# Patient Record
Sex: Female | Born: 1955 | Race: White | Hispanic: No | Marital: Married | Smoking: Former smoker
Health system: Southern US, Community
[De-identification: ages and names within clinical notes are randomized; demographics above are authoritative.]

## PROBLEM LIST (undated history)

## (undated) ENCOUNTER — Encounter: Attending: Gastroenterology | Primary: Gastroenterology

## (undated) ENCOUNTER — Encounter

## (undated) ENCOUNTER — Encounter: Attending: Chiropractor | Primary: Chiropractor

## (undated) ENCOUNTER — Ambulatory Visit: Attending: Pharmacist | Primary: Pharmacist

## (undated) ENCOUNTER — Ambulatory Visit

## (undated) ENCOUNTER — Telehealth

## (undated) ENCOUNTER — Ambulatory Visit: Payer: MEDICARE

## (undated) DIAGNOSIS — I700 Atherosclerosis of aorta: Secondary | ICD-10-CM

## (undated) DIAGNOSIS — L98499 Non-pressure chronic ulcer of skin of other sites with unspecified severity: Secondary | ICD-10-CM

## (undated) DIAGNOSIS — K219 Gastro-esophageal reflux disease without esophagitis: Secondary | ICD-10-CM

## (undated) DIAGNOSIS — N1330 Unspecified hydronephrosis: Secondary | ICD-10-CM

## (undated) DIAGNOSIS — R008 Other abnormalities of heart beat: Secondary | ICD-10-CM

## (undated) DIAGNOSIS — E7800 Pure hypercholesterolemia, unspecified: Secondary | ICD-10-CM

## (undated) DIAGNOSIS — F419 Anxiety disorder, unspecified: Secondary | ICD-10-CM

## (undated) DIAGNOSIS — I472 Ventricular tachycardia: Secondary | ICD-10-CM

## (undated) DIAGNOSIS — M1990 Unspecified osteoarthritis, unspecified site: Secondary | ICD-10-CM

## (undated) DIAGNOSIS — Z87442 Personal history of urinary calculi: Secondary | ICD-10-CM

## (undated) DIAGNOSIS — I809 Phlebitis and thrombophlebitis of unspecified site: Secondary | ICD-10-CM

## (undated) DIAGNOSIS — J302 Other seasonal allergic rhinitis: Secondary | ICD-10-CM

## (undated) DIAGNOSIS — D649 Anemia, unspecified: Secondary | ICD-10-CM

## (undated) DIAGNOSIS — H8109 Meniere's disease, unspecified ear: Secondary | ICD-10-CM

## (undated) DIAGNOSIS — K5090 Crohn's disease, unspecified, without complications: Secondary | ICD-10-CM

## (undated) HISTORY — PX: TONSILLECTOMY: SUR1361

## (undated) HISTORY — PX: FOOT SURGERY: SHX648

## (undated) HISTORY — PX: UPPER GI ENDOSCOPY: SHX6162

## (undated) HISTORY — PX: APPENDECTOMY: SHX54

## (undated) HISTORY — PX: OVARIAN CYST REMOVAL: SHX89

## (undated) HISTORY — PX: BOWEL RESECTION: SHX1257

---
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
## 1979-02-18 DIAGNOSIS — K5090 Crohn's disease, unspecified, without complications: Secondary | ICD-10-CM | POA: Insufficient documentation

## 1979-06-20 DIAGNOSIS — D649 Anemia, unspecified: Secondary | ICD-10-CM | POA: Insufficient documentation

## 2000-08-14 ENCOUNTER — Emergency Department (HOSPITAL_COMMUNITY): Admission: EM | Admit: 2000-08-14 | Discharge: 2000-08-14 | Payer: Self-pay | Admitting: Emergency Medicine

## 2000-08-14 ENCOUNTER — Encounter: Payer: Self-pay | Admitting: Emergency Medicine

## 2007-09-29 ENCOUNTER — Encounter: Admission: RE | Admit: 2007-09-29 | Discharge: 2007-09-29 | Payer: Self-pay | Admitting: Gastroenterology

## 2007-12-11 DIAGNOSIS — I472 Ventricular tachycardia, unspecified: Secondary | ICD-10-CM

## 2007-12-11 HISTORY — DX: Ventricular tachycardia: I47.2

## 2007-12-11 HISTORY — DX: Ventricular tachycardia, unspecified: I47.20

## 2008-06-19 DIAGNOSIS — R008 Other abnormalities of heart beat: Secondary | ICD-10-CM | POA: Insufficient documentation

## 2008-08-06 ENCOUNTER — Ambulatory Visit: Payer: Self-pay | Admitting: Internal Medicine

## 2008-08-12 ENCOUNTER — Inpatient Hospital Stay (HOSPITAL_COMMUNITY): Admission: EM | Admit: 2008-08-12 | Discharge: 2008-08-13 | Payer: Self-pay | Admitting: Emergency Medicine

## 2008-08-12 ENCOUNTER — Ambulatory Visit: Payer: Self-pay | Admitting: Cardiology

## 2008-08-19 ENCOUNTER — Ambulatory Visit: Payer: Self-pay

## 2008-10-28 ENCOUNTER — Ambulatory Visit: Payer: Self-pay | Admitting: Internal Medicine

## 2009-01-20 ENCOUNTER — Inpatient Hospital Stay (HOSPITAL_COMMUNITY): Admission: EM | Admit: 2009-01-20 | Discharge: 2009-01-22 | Payer: Self-pay | Admitting: Emergency Medicine

## 2009-01-30 ENCOUNTER — Emergency Department (HOSPITAL_COMMUNITY): Admission: EM | Admit: 2009-01-30 | Discharge: 2009-01-31 | Payer: Self-pay | Admitting: Emergency Medicine

## 2009-06-19 DIAGNOSIS — K219 Gastro-esophageal reflux disease without esophagitis: Secondary | ICD-10-CM | POA: Insufficient documentation

## 2009-07-05 ENCOUNTER — Encounter: Admission: RE | Admit: 2009-07-05 | Discharge: 2009-07-05 | Payer: Self-pay | Admitting: Gastroenterology

## 2009-08-29 ENCOUNTER — Encounter (HOSPITAL_COMMUNITY): Admission: RE | Admit: 2009-08-29 | Discharge: 2009-11-27 | Payer: Self-pay | Admitting: Gastroenterology

## 2009-12-05 ENCOUNTER — Encounter (HOSPITAL_COMMUNITY): Admission: RE | Admit: 2009-12-05 | Discharge: 2010-03-05 | Payer: Self-pay | Admitting: Gastroenterology

## 2010-03-13 ENCOUNTER — Encounter (HOSPITAL_COMMUNITY): Admission: RE | Admit: 2010-03-13 | Discharge: 2010-06-08 | Payer: Self-pay | Admitting: Gastroenterology

## 2010-07-17 ENCOUNTER — Encounter (HOSPITAL_COMMUNITY): Admission: RE | Admit: 2010-07-17 | Discharge: 2010-07-17 | Payer: Self-pay | Admitting: Gastroenterology

## 2010-09-15 ENCOUNTER — Encounter (HOSPITAL_COMMUNITY)
Admission: RE | Admit: 2010-09-15 | Discharge: 2010-11-09 | Payer: Self-pay | Source: Home / Self Care | Admitting: Gastroenterology

## 2011-01-04 ENCOUNTER — Encounter (HOSPITAL_COMMUNITY)
Admission: RE | Admit: 2011-01-04 | Discharge: 2011-01-09 | Payer: Self-pay | Source: Home / Self Care | Attending: Gastroenterology | Admitting: Gastroenterology

## 2011-03-01 ENCOUNTER — Encounter (HOSPITAL_COMMUNITY): Payer: BC Managed Care – PPO | Attending: Gastroenterology

## 2011-03-01 DIAGNOSIS — K5090 Crohn's disease, unspecified, without complications: Secondary | ICD-10-CM | POA: Insufficient documentation

## 2011-03-27 LAB — URINALYSIS, ROUTINE W REFLEX MICROSCOPIC
Bilirubin Urine: NEGATIVE
Bilirubin Urine: NEGATIVE
Glucose, UA: NEGATIVE mg/dL
Ketones, ur: NEGATIVE mg/dL
Ketones, ur: NEGATIVE mg/dL
Leukocytes, UA: NEGATIVE
Nitrite: NEGATIVE
Protein, ur: NEGATIVE mg/dL
Protein, ur: NEGATIVE mg/dL

## 2011-03-27 LAB — CBC
HCT: 32.1 % — ABNORMAL LOW (ref 36.0–46.0)
HCT: 37.3 % (ref 36.0–46.0)
MCV: 93.3 fL (ref 78.0–100.0)
MCV: 93.7 fL (ref 78.0–100.0)
MCV: 91.9 fL (ref 78.0–100.0)
Platelets: 227 10*3/uL (ref 150–400)
Platelets: 256 10*3/uL (ref 150–400)
RBC: 4.05 MIL/uL (ref 3.87–5.11)
RDW: 15.9 % — ABNORMAL HIGH (ref 11.5–15.5)
WBC: 6.3 10*3/uL (ref 4.0–10.5)
WBC: 6.8 10*3/uL (ref 4.0–10.5)

## 2011-03-27 LAB — LIPID PANEL
Total CHOL/HDL Ratio: 2.4 RATIO
Triglycerides: 92 mg/dL (ref <150)
VLDL: 18 mg/dL (ref 0–40)

## 2011-03-27 LAB — PHOSPHORUS: Phosphorus: 3.1 mg/dL (ref 2.3–4.6)

## 2011-03-27 LAB — MAGNESIUM
Magnesium: 0.7 mg/dL — CL (ref 1.5–2.5)
Magnesium: 1.6 mg/dL (ref 1.5–2.5)

## 2011-03-27 LAB — BASIC METABOLIC PANEL
BUN: 9 mg/dL (ref 6–23)
CO2: 27 mEq/L (ref 19–32)
Calcium: 7.3 mg/dL — ABNORMAL LOW (ref 8.4–10.5)
Chloride: 105 mEq/L (ref 96–112)
Creatinine, Ser: 0.86 mg/dL (ref 0.4–1.2)
GFR calc Af Amer: >60 mL/min (ref >60)
GFR calc non Af Amer: >60 mL/min (ref >60)
Glucose, Bld: 96 mg/dL (ref 70–99)
Potassium: 3 mEq/L — ABNORMAL LOW (ref 3.5–5.1)
Sodium: 143 mEq/L (ref 135–145)

## 2011-03-27 LAB — DIFFERENTIAL
Basophils Relative: 0 % (ref 0–1)
Eosinophils Absolute: 0.1 10*3/uL (ref 0.0–0.7)
Eosinophils Absolute: 0 10*3/uL (ref 0.0–0.7)
Eosinophils Relative: 1 % (ref 0–5)
Lymphs Abs: 1.6 10*3/uL (ref 0.7–4.0)
Monocytes Absolute: 0.3 10*3/uL (ref 0.1–1.0)
Monocytes Relative: 7 % (ref 3–12)
Monocytes Relative: 5 % (ref 3–12)
Neutrophils Relative %: 66 % (ref 43–77)

## 2011-03-27 LAB — CARDIAC PANEL(CRET KIN+CKTOT+MB+TROPI)
CK, MB: 1.3 ng/mL (ref 0.3–4.0)
Relative Index: INVALID (ref 0.0–2.5)
Troponin I: <0.01 ng/mL (ref 0.00–0.06)

## 2011-03-27 LAB — COMPREHENSIVE METABOLIC PANEL
ALT: 15 U/L (ref 0–35)
AST: 25 U/L (ref 0–37)
Albumin: 3.1 g/dL — ABNORMAL LOW (ref 3.5–5.2)
BUN: 6 mg/dL (ref 6–23)
CO2: 25 mEq/L (ref 19–32)
Calcium: 8 mg/dL — ABNORMAL LOW (ref 8.4–10.5)
Calcium: 9.2 mg/dL (ref 8.4–10.5)
Chloride: 106 mEq/L (ref 96–112)
Creatinine, Ser: 0.92 mg/dL (ref 0.4–1.2)
GFR calc Af Amer: 58 mL/min — ABNORMAL LOW (ref >60)
GFR calc non Af Amer: 48 mL/min — ABNORMAL LOW (ref >60)
Potassium: 3.3 mEq/L — ABNORMAL LOW (ref 3.5–5.1)
Sodium: 137 mEq/L (ref 135–145)
Total Protein: 5.4 g/dL — ABNORMAL LOW (ref 6.0–8.3)

## 2011-03-27 LAB — CALCIUM: Calcium: 7.4 mg/dL — ABNORMAL LOW (ref 8.4–10.5)

## 2011-03-27 LAB — POTASSIUM: Potassium: 3.6 mEq/L (ref 3.5–5.1)

## 2011-03-27 LAB — CK TOTAL AND CKMB (NOT AT ARMC): Relative Index: INVALID (ref 0.0–2.5)

## 2011-03-27 LAB — POCT I-STAT, CHEM 8
BUN: 15 mg/dL (ref 6–23)
Calcium, Ion: 1.29 mmol/L (ref 1.12–1.32)
Creatinine, Ser: 1.2 mg/dL (ref 0.4–1.2)
TCO2: 25 mmol/L (ref 0–100)

## 2011-03-27 LAB — VITAMIN B12: Vitamin B-12: 319 pg/mL (ref 211–911)

## 2011-03-27 LAB — PROTIME-INR
INR: 1 (ref 0.00–1.49)
Prothrombin Time: 13.4 seconds (ref 11.6–15.2)

## 2011-03-27 LAB — TROPONIN I: Troponin I: <0.01 ng/mL (ref 0.00–0.06)

## 2011-03-27 LAB — POCT CARDIAC MARKERS: Myoglobin, poc: 63.2 ng/mL (ref 12–200)

## 2011-03-27 LAB — HEMOGLOBIN A1C: Mean Plasma Glucose: 120 mg/dL

## 2011-04-24 NOTE — H&P (Signed)
Cheryl [facility], Cheryl Ferguson                ACCOUNT NO.:  [id number]   MEDICAL RECORD NO.:  [id number]          PATIENT TYPE:  INP   LOCATION:  2926                         FACILITY:  [facility]   PHYSICIAN:  Minus Breeding, MD, FACCDATE OF BIRTH:  [birth year]   DATE OF ADMISSION:  08/12/2008  DATE OF DISCHARGE:                              HISTORY & PHYSICAL   CHIEF COMPLAINT:  Chest pain.   HISTORY OF PRESENT ILLNESS:  Ms. Cheryl [facility] is a 55 year old female with a  history of brief runs of nonsustained VT.  She saw Dr. Lovena Le on August 06, 2008, because her palpitations had been increasing and received  prescriptions for verapamil.  She had palpitations yesterday and took  one verapamil.  Her symptoms improved, and no further action was taken.  Today at approximately 3 p.m., she had onset of frequent palpitations.  She describes them as a fluttering feeling in her chest.  She then  developed an aching and cramping feeling in her chest as well as chest  tightness.  Her symptoms did not resolve, so she went to [facility].  There, she was evaluated and referred to [facility].  Currently, she is not having palpitations, but she does  still complain of chest tightness at a 2/10.  The palpitations and chest  tightness are associated with some slight presyncope.  She also has a  little dyspnea, but denies nausea, vomiting, or diaphoresis.   PAST MEDICAL HISTORY:  1. She has no history of diabetes, hypertension, hyperlipidemia, or      family history of premature coronary artery disease.  2. She has a history of tobacco use.  3. She has a history of PVCs and has had brief episodes of      nonsustained VT per Dr. Tanna Furry note from 2004.  4. History of Crohn disease.   PAST SURGICAL HISTORY:  She is status post C-section, bowel resection,  tonsillectomy, and ovarian cyst removal.   ALLERGIES:  She is allergic or intolerant to [place].   CURRENT MEDICATIONS:  1.  Entocort EC 3 mg 2 tablets daily.  2. Lunesta 2 mg nightly.  3. Verapamil 40 mg p.o. q.6 h. p.r.n.  4. Verapamil ER 240 mg daily (has not started yet per instructions).  5. Maxzide one tablet daily.  6. Nexium 40 mg daily.  7. Pentasa 500 mg 2 tablets q.i.d.  8. Lisinopril 20 mg daily.   SOCIAL HISTORY:  She lives in [place] with her husband and works  for [employer].  She has an approximately 35-pack-year  history of tobacco use, but quit in the last few months.  She denies  alcohol or drug abuse.  She feels that her diet is reasonable and does  not exercise.   FAMILY HISTORY:  Her mother died at age 37 of cancer, and her father  died at age 38 with the CVA, but neither parents nor siblings have heart  disease.   REVIEW OF SYSTEMS:  She has had no fevers, chills, or recent illnesses.  Her Crohn disease has been moderate per  the patient.  Has chest pain as  described above.  She has occasional diarrhea and abdominal pain.  Full  14-point of review of systems is otherwise negative.   PHYSICAL EXAMINATION:  VITAL SIGNS:  Temperature is 97.7, blood pressure  173/86, pulse 92, respiratory rate 22, and O2 saturation 98% on room  air.  GENERAL:  She is a well-developed and anxious white female.  HEENT:  Normal.  NECK:  There is no lymphadenopathy, thyromegaly, bruit, or JVD noted.  CV:  Her heart is regular in rate and rhythm with an S1 and S2.  No  significant murmur, rub, or gallop is noted.  Distal pulses are intact  in all 4 extremities.  LUNGS:  Clear to auscultation bilaterally.  SKIN:  No rashes or lesions are noted.  ABDOMEN:  Soft and nontender with active bowel sounds.  There is no  rebound and no guarding noted.  EXTREMITIES:  There is no cyanosis, clubbing, or edema noted.  MUSCULOSKELETAL:  There is no joint deformity or effusion, and no spine  or CVA tenderness.  NEURO:  She is alert and oriented.  Cranial nerves II through XII  grossly intact.    Chest x-ray is normal.   EKG with sinus rhythm, rate 72, with a pair of PVCs.  There are no acute  ischemic changes.  There are minimal lateral T-wave changes, the  significance of which is not clear.   LABORATORY DATA:  Hemoglobin 10.8, hematocrit 33.4, WBC 6.7, and  platelets 277.  Sodium 135, potassium 3.5, chloride 108, CO2 of 30.  BUN  10, creatinine 1.07, and glucose 103.  GFR 54.  Albumin 3.4, calcium  8.3, magnesium 1.04, and point-of-care markers negative x1.   IMPRESSION:  Ms. [facility] was seen today by Dr. Percival Spanish.  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  She has had a  cardiac MRI and single-averaged electrocardiogram, both of which were  negative for arrhythmogenic right ventricular dysplasia.  Today, she had  tachy palpations and took instant release verapamil.  She developed  chest pain described as a tightness.  She was treated at urgent care  with nitroglycerin and her symptoms improved.  She has no acute EKG  changes, but ventricular couplets were seen, which were treated with IV  lidocaine and resolved.  The pain went away initially, but is now  recurred.   PLAN:  1. Ventricular ectopy and nonsustained VT:  We will continue the      lidocaine if she is symptomatic with the PVCs.  We will start      verapamil short-acting scheduled q.6 h. and transition to sustained      release per Dr. Lovena Le.  He can decide on continuing this      medication versus other antiarrhythmics or ablation.  2. Chest pain:  We will rule out MI.  She will be started on IV      nitroglycerin.  An outpatient Myoview is indicated if her enzymes      are negative.  3. Hypertension:  Adding verapamil should improve this.  4. Crohn disease:  She will be continued on her home medications.  5. Hypomagnesemia.  We will add Mag-Ox b.i.d.      Cheryl Ferries, PA-C      Minus Breeding, MD,  [facility]  Electronically Signed    RB/MEDQ  D:  08/12/2008  T:  08/13/2008  Job:  [id number] 

## 2011-04-24 NOTE — Assessment & Plan Note (Signed)
[facility] OFFICE NOTE   LASEAN, RAHMING                         MRN:          [id number]  DATE:10/28/2008                            DOB:          06-04-1956    Ms. [facility] returns today for followup.  She is a very pleasant young  woman with a history of tachypalpitations and RV outflow tract VT.  I  saw her back in August, and her palpitations had increased and we  decided to try her initially on short-acting verapamil to take on a  p.r.n. basis, but apparently she had trouble with this and had her heart  race and was actually hospitalized for a day.  She underwent stress  testing as she had some chest pain with her racing, all of this came  negative.  She was placed on long-acting verapamil and she has been  stable since.  She does have rare days where she has palpitations, but  no severe episodes.   PHYSICAL EXAMINATION:  GENERAL:  She is a pleasant, well-appearing young  woman in no distress.  VITAL SIGNS:  Blood pressure was 110/72, the pulse 76 and regular,  respirations were 18, and the weight was 144 pounds.  NECK:  No jugular venous distention.  LUNGS:  Clear bilaterally to auscultation.  No wheezes, rales, or  rhonchi are present.  There is no increased work of breathing.  CARDIOVASCULAR:  Regular rate and rhythm.  Normal S1 and S2.  ABDOMEN:  Soft, nontender.  There is no organomegaly.  EXTREMITIES:  No edema.   IMPRESSION:  1. Right ventricular outflow tract ventricular tachycardia, now on      long-acting daily verapamil with good control.  2. Palpitations secondary to right ventricular outflow tract      ventricular tachycardia.  3. Crohn disease, probably improved with regard to diarrhea from      verapamil treatment.   DISCUSSION:  Overall, Ms. [facility] is stable.  I will see her back in a  year.     Champ Mungo. Lovena Le, MD  Electronically Signed    GWT/MedQ  DD: 10/28/2008  DT:  10/28/2008  Job #: [id number]

## 2011-04-24 NOTE — Discharge Summary (Signed)
[facility], [facility]                ACCOUNT NO.:  [id number]   MEDICAL RECORD NO.:  [id number]          PATIENT TYPE:  INP   LOCATION:  2926                         FACILITY:  [facility]   PHYSICIAN:  Thompson Grayer, MD       DATE OF BIRTH:  1956-04-26   DATE OF ADMISSION:  08/12/2008  DATE OF DISCHARGE:  08/13/2008                               DISCHARGE SUMMARY   PROCEDURES:  None.   PRIMARY/FINAL DISCHARGE DIAGNOSIS:  Chest pain, cardiac enzymes negative  for myocardial infarction.  An outpatient Myoview planned.   SECONDARY DIAGNOSES:  1. Palpitations, premature ventricular contractions in pairs seen on      telemetry.  2. History of short runs of nonsustained ventricular tachycardia.  3. Crohn disease.  4. History of tobacco use, quit recently.  5. Allergy or intolerance to CODEINE and KEFLEX.  6. Status post cesarean section, bowel resection, tonsillectomy, and      ovarian cyst removal.  7. Family history of cerebrovascular accident in her father.   TIME AT DISCHARGE:  34 minutes.   HOSPITAL COURSE:  Ms. [facility] is a 55 year old female with no previous  history of coronary artery disease.  She had seen Dr. Lovena Le on August 06, 2008, then was having palpitations so received a prescription for  verapamil p.r.n. and long acting.  She had repeat palpitations and took  the p.r.n. medications and they resolved.  On the day of admission, she  had palpitations associated with chest pain.  She had not yet started  the long-acting verapamil and the short-acting verapamil did not relieve  her symptoms.  She came to the hospital where she was admitted for  further evaluation and treatment.  Her cardiac enzymes were negative for  MI.  Other labs were within normal limits except for a hemoglobin of  10.8 and hematocrit of 33.4.  MCV was within normal limits at 96.2.  Her  nonfasting blood sugar was 103.  Her BUN was 10 and creatinine of 1.07  with a GFR of 54.  Albumin was minimally low at  3.4.  Calcium was  slightly low at 8.3 and magnesium was low at 1.0.  Supplementation was  started.   On August 13, 2008, she was seen by Dr. Rayann Heman.  She had been started  on lidocaine drip and had no further palpitations on this with no PVCs  seen on telemetry after it was started.  Her chest pain had resolved.  She was encouraged to start the verapamil long acting that Dr. Lovena Le  had prescribed.  Since her chest pain had resolved and her cardiac  enzymes were negative.  He felt that she was stable for discharge and  outpatient followup.   DISCHARGE INSTRUCTIONS:  1. Her activity level is to be increased gradually.  She is to stick      to a low-fat and salt diet.  2. She is to follow up with Dr. Lovena Le on October 18, 2008, at 4:30.  3. She is to have a stress test on August 19, 2008, at noon.  4. She is to follow  up with Dr. Maceo Pro as needed.  5. Return for worsening chest pain, palpitaitons, shortness of breath,      syncope, or other concerns   DISCHARGE MEDICATIONS:  1. Verapamil 240 mg daily.  2. Maxzide 37.5/25 daily.  3. Mesalamine 500 mg 2 tablets q.i.d.  4. Entocort 3 mg 2 tablets daily.  5. Lisinopril 20 mg a day.  6. Mag-Ox 400 mg b.i.d.  7. Nexium 40 mg a day.  8. Lunesta 2 mg nightly.      Rosaria Ferries, PA-C      Thompson Grayer, MD  Electronically Signed    RB/MEDQ  D:  08/13/2008  T:  08/14/2008  Job:  [id number]   cc:   [facility] L. Maceo Pro, M.D.

## 2011-04-24 NOTE — Assessment & Plan Note (Signed)
[facility] OFFICE NOTE   MALASIA, TORAIN                         MRN:          [id number]  DATE:08/06/2008                            DOB:          1956-02-26    [facility] returns today for followup.  I have not seen her for almost 5  years in our EP clinic.  She is a very pleasant middle-aged woman with a  history of Crohn disease and palpitations documented to be due  nonsustained VT and frequent PVCs.  These appear to be coming from the  RV outflow tract.  She had a cardiac MI that demonstrated no evidence of  ARVD.  I initially had her on verapamil which she took for several  years, though eventually her symptoms improve such that she was able to  stop her verapamil completely.  The patient has been stable, but has  recently had some flaring of her Crohn disease and notes that she has  had increasingly frequent episodes of palpitations.  She returns today  for additional evaluation.  She has had no chest pain.  She denies  worsening heart failure.  She denies peripheral edema.   CURRENT MEDICATIONS:  1. Pentasa 2 tablets 4 times daily.  2. Nexium 40 a day.  3. Lisinopril 20 a day.  4. Magnesium oxide 400 mg twice daily.  5. Imuran daily.  6. __________.   PHYSICAL EXAMINATION:  GENERAL:  She is a pleasant well-appearing middle-  aged woman in no distress.  VITAL SIGNS:  Blood pressure was 104/60, the pulse was 60 and regular,  respirations were 18, and the weight was 142 pounds.  NECK:  No jugular venous distention.  LUNGS:  Clear bilaterally to auscultation.  No wheezes, rales, or  rhonchi are present.  CARDIOVASCULAR:  Regular rate and rhythm with normal S1 and S2.  There  are no murmurs, rubs, or gallops present.  ABDOMEN:  Soft and nontender.  There is no organomegaly.  EXTREMITIES:  Demonstrate no cyanosis, clubbing, or edema.  The pulses  were 2+ and symmetric.   EKG demonstrates sinus  rhythm with nonspecific T-wave abnormality.   IMPRESSION:  1. Worsening palpitations, previously controlled with verapamil.      Secondary to premature ventricular contractions coming from the      right ventricular outflow tract region.  2. Crohn disease.   DISCUSSION:  I have recommend that [facility] start back on verapamil.  The real question is whether she needs to take it on a p.r.n. basis with  a short-acting dose or whether she will do better with a long-term  verapamil.  I have given her 2 prescriptions today, one for verapamil  short-acting 90 mg daily that she can take if she has symptoms which  typically occur in early afternoon and the second is to go on a long-  acting verapamil with a dose of 240 mg a day taken once daily.  If she  has frequent episodes of PVCs and palpitations, then I have to ask her  to go on the long-acting version of verapamil.  I will plan to see the  patient back in several months.  She is instructed to call us if her  verapamil does not work for her.      Champ Mungo. Lovena Le, MD  Electronically Signed    GWT/MedQ  DD: 08/06/2008  DT: 08/07/2008  Job #: [id number]   cc:   [facility] L. Maceo Pro, M.D.

## 2011-04-24 NOTE — H&P (Signed)
[facility], [facility]                ACCOUNT NO.:  [id number]   MEDICAL RECORD NO.:  [id number]          PATIENT TYPE:  INP   LOCATION:  1308                         FACILITY:  [facility]   PHYSICIAN:  Farris Has, MDDATE OF BIRTH:  1956-08-03   DATE OF ADMISSION:  01/20/2009  DATE OF DISCHARGE:                              HISTORY & PHYSICAL   PRIMARY CARE Meylin Stenzel:  Audree Camel. Alyson Ingles, M.D.   GI SPECIALIST:  Joyice Faster. Oletta Lamas, M.D.   CARDIOLOGIST:  Jettie Booze, M.D.   The patient is a 55 year old female with history of Crohn disease and  chronic magnesium deficiency.  The patient was at her baseline of health  until today when she developed some palpitations, no chest pain, no  shortness of breath.  She also developed some sensations of tingling  around her face and spasms in the muscles of the face, hands and neck  which scared her, and she came into the emergency department.   She has been wearing a heart monitor for about 2 weeks per Dr.  Hassell Done orders, given history of ventricular tachycardia.  She  supposedly had undergone an echogram just a few weeks ago at his office,  results of which we do not have.   Otherwise review of systems unremarkable.  No fevers and no chills.  No  nausea, no vomiting, no constipation or chest pain.  The patient does  have occasional diarrhea once a day or so.   PAST MEDICAL HISTORY:  1. Significant for Crohn disease.  2. GERD.  3. Hypokalemia.  4. Hypomagnesemia.  5. Paroxysmal ventricular tachycardia.   SOCIAL HISTORY:  The patient never smoked or drank, does not abuse  drugs.  Lives at home.   FAMILY HISTORY:  Noncontributory.   ALLERGIES:  CODEINE and KEFLEX.   MEDICATIONS:  1. Entocort 3 mg daily.  2. Imuran 50 mg daily.  3. Magnesium 400 mg daily.  4. Nexium 40 mg p.o. daily.  5. Verapamil 240 mg daily.  6. Lisinopril 20 mg daily.  7. Pentasa 500 mg p.o. 2 tablets q.i.d.  8. Xanax 0.5 mg p.o. q.h.s. and 0.25 mg  as needed every 6-8 hours      throughout the day.   VITALS:  Temperature 97.5, blood pressure 161/83, now down to 113/80,  pulse 86, respirations 16, saturation 96% on room air .  The patient  appears to be currently in no acute distress.  HEAD:  Nontraumatic.  Moist mucous membranes.  LUNGS:  Clear to auscultation bilaterally.  HEART:  Regular rate and rhythm.  No murmurs, rubs or gallops.  LUNGS:  Clear to auscultation bilaterally.  ABDOMEN:  Soft, nontender, nondistended.  LOWER EXTREMITIES:  No clubbing, cyanosis or edema.  NEUROLOGICALLY: Intact.  Her spasms currently resolved.   LABS:  The patient had no CBC.  Chemistry significant for sodium 143,  potassium 3.0, creatinine 0.86, calcium 7.3, magnesium level is 0.7.  EKG obtained and showing normal sinus rhythm, heart rate of 79, no  ischemic changes noted, no U waves noted.   ASSESSMENT AND PLAN:  This is a 55 year old  female with a history of  hypomagnesemia and hypokalemia of unclear etiology and Crohn disease  presents with low potassium and low magnesium and calcium.   1. Low magnesium;  We will replace.  I wonder if patient will need      nephrology workup to try to help [place] figure out the cause of her      constant magnesium loss.  We will try to obtain urine magnesium      level, but that may be not as helpful since the patient already      received magnesium.  2. Hypokalemia:  Will replace.  3. Low calcium:  Will replace.  4. Palpitations:  The patient does have history of V-tach, we will      place on telemetry.  The patient already had a recent      echocardiogram.  Will need to contact Dr. Irish Lack to see what the      results of that are.  Will cycle cardiac enzymes, wonder if      electrolyte imbalances may contribute to palpitations.  5. Prophylaxis:  Protonix plus Lovenox.  6. History of Crohn's:  Will continue home meds, currently stable.      Farris Has, MD  Electronically Signed      AVD/MEDQ  D:  01/21/2009  T:  01/21/2009  Job:  [id number]   cc:   [facility] A. Alyson Ingles, M.D.  Fax: [phone]

## 2011-04-24 NOTE — Discharge Summary (Signed)
[facility]yl [facility], [facility]                ACCOUNT NO.:  [id number]   MEDICAL RECORD NO.:  [id number]          PATIENT TYPE:  INP   LOCATION:  [place]                         FACILITY:  [facility]   PHYSICIAN:  Corinna L. Conley Canal, MDDATE OF BIRTH:  08/07/1956   DATE OF ADMISSION:  01/20/2009  DATE OF DISCHARGE:  01/22/2009                               DISCHARGE SUMMARY   DISCHARGE DIAGNOSES:  1. Hypomagnesemia.  2. Hyperkalemia.  3. History of Crohn's, currently quiescent.  4. Hypertension.  5. History of ventricular arrhythmias, none during this      hospitalization.  6. Gastroesophageal reflux disease.  7. Anxiety.  8. History of several bowel resections.   DISCHARGE MEDICATIONS:  Stop Entocort EC. Hold lisinopril p.r.n.  systolic blood pressure below 100. If systolic blood pressure remains  below 100, hold for verapamil as well and call Dr. Irish Lack for  instructions. Potassium 20 mEq twice a day until gone.  Continue  magnesium oxide 100 mg t.i.d. Imuran 50 mg a day, Nexium 40 mg a day.  Pentasa 1000 mg q.i.d. Xanax 0.25 mg b.i.d. to t.i.d. Lunesta as needed.  B12 shot monthly.   CONDITION:  Stable.   ACTIVITY:  Ad lib.   DIET:  Low salt.   FOLLOW-UP:  With Dr. Maceo Pro within a week to check potassium and  magnesium levels as well as blood pressure.   CONSULTATIONS:  None.   PROCEDURES:  None.   LABS:  From her potassium on admission was 3, magnesium was 0.7.  CBC  significant for hemoglobin 9.8, hematocrit 29.5, MCV 93. Otherwise  normal CBC. At discharge her potassium is 3.6, magnesium is 2.6,  hemoglobin A1c 5.8.  Serial cardiac enzymes negative.  LDL 99, HDL 86.  Total cholesterol 203, triglycerides 92, TSH 1.831.  B12 level is 319.  Serum folate greater than 20.  Urinalysis showed trace blood, otherwise  negative.  EKG showed normal sinus rhythm with nonspecific changes.   HISTORY AND HOSPITAL COURSE:  Ms. [facility] is a pleasant, 55 year old white  female patient of Dr. Maceo Pro  who presented with palpitations.  She also  had paresthesias and muscle spasms in her face and hands.  She has a  history of hypomagnesemia in the past which has been refractory to oral  supplementation.  She reports that this started around the same time  that the Entocort started.  She had a 24-hour urine for magnesium done  as an outpatient which I reviewed the results of.  It showed  undetectable levels of magnesium.  The patient denies any worsening of  her Crohn's symptoms or diarrhea.  She has had several bowel resections  related to her Crohn's.  She was placed on telemetry where she remained  in normal sinus rhythm even during episodes of sensations of  palpitations.  She was repleted with magnesium IV and orally.  She also  was repleted with potassium IV and orally.  Other workup was negative.  In reviewing the literature, Entocort can have mineralocorticoid and  adrenals effects.  Apparently it can lead to hypokalemia, question  whether this may also have resulted  in hypomagnesemia.  I have discussed  the case with Dr. Oletta Lamas, who reported that he had planned on stopping  her Entocort with the next visit anyway.  He agreed that we should try  stopping it to see if this would help.  The patient had no diarrhea  during her hospitalization.  She reports that she drinks 1 or 2 beers a  year, and vehemently denies drinking alcohol to excess. She was feeling  much better, and her electrolytes were normalized at the time of  discharge. Total time on the day of discharge was 40 minutes.      Corinna L. [facility], MD  Electronically Signed     CLS/MEDQ  D:  01/22/2009  T:  01/23/2009  Job:  [id number]   cc:   [facility] L. Maceo Pro, M.D.  Fax: [place]. Rolla Flatten., M.D.  Fax: [phone]

## 2011-04-26 ENCOUNTER — Encounter (HOSPITAL_COMMUNITY): Payer: BC Managed Care – PPO | Attending: Gastroenterology

## 2011-04-26 ENCOUNTER — Encounter (HOSPITAL_COMMUNITY): Payer: Self-pay

## 2011-04-26 DIAGNOSIS — K5090 Crohn's disease, unspecified, without complications: Secondary | ICD-10-CM | POA: Insufficient documentation

## 2011-04-27 ENCOUNTER — Encounter (HOSPITAL_COMMUNITY): Payer: Self-pay

## 2011-06-07 ENCOUNTER — Encounter (HOSPITAL_COMMUNITY)
Admission: RE | Admit: 2011-06-07 | Discharge: 2011-06-07 | Disposition: A | Discharge status: Home / Self Care | Payer: BC Managed Care – PPO | Source: Ambulatory Visit | Attending: Obstetrics and Gynecology | Admitting: Obstetrics and Gynecology

## 2011-06-07 LAB — CBC
Hemoglobin: 11.3 g/dL — ABNORMAL LOW (ref 12.0–15.0)
MCH: 29 pg (ref 26.0–34.0)
MCHC: 31.6 g/dL (ref 30.0–36.0)
MCV: 92 fL (ref 78.0–100.0)

## 2011-06-11 ENCOUNTER — Ambulatory Visit (HOSPITAL_COMMUNITY)
Admission: RE | Admit: 2011-06-11 | Discharge: 2011-06-11 | Disposition: A | Discharge status: Home / Self Care | Payer: BC Managed Care – PPO | Source: Ambulatory Visit | Attending: Obstetrics and Gynecology | Admitting: Obstetrics and Gynecology

## 2011-06-11 ENCOUNTER — Other Ambulatory Visit: Payer: Self-pay | Admitting: Obstetrics and Gynecology

## 2011-06-11 DIAGNOSIS — N840 Polyp of corpus uteri: Secondary | ICD-10-CM | POA: Insufficient documentation

## 2011-06-11 DIAGNOSIS — Z01812 Encounter for preprocedural laboratory examination: Secondary | ICD-10-CM | POA: Insufficient documentation

## 2011-06-11 DIAGNOSIS — N950 Postmenopausal bleeding: Secondary | ICD-10-CM | POA: Insufficient documentation

## 2011-06-11 DIAGNOSIS — Z01818 Encounter for other preprocedural examination: Secondary | ICD-10-CM | POA: Insufficient documentation

## 2011-06-11 LAB — BASIC METABOLIC PANEL
BUN: 13 mg/dL (ref 6–23)
Calcium: 9.5 mg/dL (ref 8.4–10.5)
Creatinine, Ser: 1.01 mg/dL (ref 0.50–1.10)
GFR calc non Af Amer: 57 mL/min — ABNORMAL LOW (ref >60)
Glucose, Bld: 94 mg/dL (ref 70–99)
Sodium: 138 mEq/L (ref 135–145)

## 2011-06-22 ENCOUNTER — Encounter (HOSPITAL_COMMUNITY): Payer: BC Managed Care – PPO

## 2011-06-29 ENCOUNTER — Encounter (HOSPITAL_COMMUNITY): Payer: BC Managed Care – PPO | Attending: Gastroenterology

## 2011-06-29 DIAGNOSIS — K5090 Crohn's disease, unspecified, without complications: Secondary | ICD-10-CM | POA: Insufficient documentation

## 2011-06-29 NOTE — Op Note (Signed)
  Cheryl Ferguson, Cheryl Ferguson                ACCOUNT NO.:  [id number]  MEDICAL RECORD NO.:  [id number]  LOCATION:  [facility]                          [facility]AC[facility]:  [facility]  PHYSICIAN:  Cynthia P. Romine, M.D.DATE OF BIRTH:  [birth year]  DATE OF PROCEDURE:  06/11/2011 DATE OF DISCHARGE:                              OPERATIVE REPORT   PREOPERATIVE DIAGNOSIS:  Postmenopausal bleeding with endometrial polyps seen on sonohysterogram.  POSTOPERATIVE DIAGNOSIS:  Postmenopausal bleeding with endometrial polyps seen on sonohysterogram.  Path pending.  PROCEDURE:  Hysteroscopy with resection of polyp, D and C.  SURGEON:  Cynthia P. Romine, MD  ANESTHESIA:  General by LMA.  ESTIMATED BLOOD LOSS:  Minimal.  GLYCINE DEFICIT:  60 mL.  COMPLICATIONS:  None.  PROCEDURE:  The patient was taken to the operating room and after the induction of adequate general anesthesia by LMA, she was placed in dorsal lithotomy position and prepped and draped in the usual fashion. Bladder was drained with a red rubber catheter.  A posterior weighted and anterior Sims retractor were placed and the cervix was grasped on its anterior lip with a single-tooth tenaculum.  Paracervical block was instituted by injecting 10 mL of 1% Xylocaine at each of 3 and 9 o'clock.  The uterus then sounded to 7 cm.  The cervix was dilated to #31 Kennon Rounds.  The operative hysteroscope was introduced with single loop cautery.  The cavity appeared atrophic and the endometrium was quite thin except in the lower uterine segment.  On the patient's right, there was a 1 cm smooth appearing polyp that had been seen on office sonohysterography.  Single loop cautery was used to excise the polyp. The pieces were removed through the cervix.  Hysteroscopy then revealed the uterus to be clear.  Photographic documentation was taken and the scope was withdrawn.  Gentle sharp curettage was then carried out with minimal tissue obtained.  The specimen was sent to  pathology.  The instruments were removed from the vagina and the procedure was terminated.  The patient tolerated it well, went in satisfactory condition to Post Anesthesia recovery.    Cynthia P. Romine, M.D.    CPR/MEDQ  D:  06/11/2011  T:  06/11/2011  Job:  [id number]  Electronically Signed by Elyse Hsu M.D. on 06/29/2011 01:31:09 PM

## 2011-08-23 ENCOUNTER — Encounter (HOSPITAL_COMMUNITY): Payer: BC Managed Care – PPO | Attending: Gastroenterology

## 2011-08-23 DIAGNOSIS — K5090 Crohn's disease, unspecified, without complications: Secondary | ICD-10-CM | POA: Insufficient documentation

## 2011-09-12 LAB — MAGNESIUM: Magnesium: 1 — ABNORMAL LOW

## 2011-09-12 LAB — DIFFERENTIAL
Eosinophils Relative: 0
Lymphocytes Relative: 22
Lymphs Abs: 1.5
Monocytes Absolute: 0.4
Neutro Abs: 4.8

## 2011-09-12 LAB — POCT CARDIAC MARKERS
CKMB, poc: 1.3
Troponin i, poc: <0.05

## 2011-09-12 LAB — CK TOTAL AND CKMB (NOT AT ARMC)
Relative Index: 1.4
Total CK: 106

## 2011-09-12 LAB — CBC
MCHC: 32.4
MCV: 96.2
Platelets: 277
RDW: 15.6 — ABNORMAL HIGH
WBC: 6.7

## 2011-09-12 LAB — CARDIAC PANEL(CRET KIN+CKTOT+MB+TROPI)
CK, MB: 1.3
CK, MB: 1.6
Relative Index: INVALID
Total CK: 86

## 2011-09-12 LAB — TROPONIN I: Troponin I: 0.01

## 2011-09-12 LAB — COMPREHENSIVE METABOLIC PANEL
AST: 23
Albumin: 3.4 — ABNORMAL LOW
Calcium: 8.3 — ABNORMAL LOW
Creatinine, Ser: 1.07
GFR calc Af Amer: >60

## 2011-10-10 ENCOUNTER — Other Ambulatory Visit (HOSPITAL_COMMUNITY): Payer: Self-pay | Admitting: *Deleted

## 2011-10-18 ENCOUNTER — Encounter (HOSPITAL_COMMUNITY)
Admission: RE | Admit: 2011-10-18 | Discharge: 2011-10-18 | Disposition: A | Discharge status: Home / Self Care | Payer: BC Managed Care – PPO | Source: Ambulatory Visit | Attending: Gastroenterology | Admitting: Gastroenterology

## 2011-10-18 DIAGNOSIS — K5090 Crohn's disease, unspecified, without complications: Secondary | ICD-10-CM | POA: Insufficient documentation

## 2011-10-18 MED ORDER — SODIUM CHLORIDE 0.9 % IV SOLN
200.0000 mg | INTRAVENOUS | Status: AC
  Administered 2011-10-18: 200 mg via INTRAVENOUS
  Filled 2011-10-18: qty 20

## 2011-10-18 MED ORDER — ACETAMINOPHEN 500 MG PO TABS
ORAL_TABLET | ORAL | Status: AC
  Filled 2011-10-18: qty 2

## 2011-10-18 MED ORDER — ONDANSETRON HCL 4 MG/2ML IJ SOLN
INTRAMUSCULAR | Status: AC
  Administered 2011-10-18: 4 mg via INTRAVENOUS
  Filled 2011-10-18: qty 2

## 2011-10-18 MED ORDER — ACETAMINOPHEN 500 MG PO TABS
1000.0000 mg | ORAL_TABLET | Freq: Once | ORAL | Status: AC
  Administered 2011-10-18: 1000 mg via ORAL

## 2011-10-18 MED ORDER — ONDANSETRON HCL 4 MG/2ML IJ SOLN
4.0000 mg | Freq: Once | INTRAMUSCULAR | Status: AC
  Administered 2011-10-18: 4 mg via INTRAVENOUS

## 2011-12-13 ENCOUNTER — Encounter (HOSPITAL_COMMUNITY)
Admission: RE | Admit: 2011-12-13 | Discharge: 2011-12-13 | Disposition: A | Discharge status: Home / Self Care | Payer: BC Managed Care – PPO | Source: Ambulatory Visit | Attending: Gastroenterology | Admitting: Gastroenterology

## 2011-12-13 DIAGNOSIS — K5090 Crohn's disease, unspecified, without complications: Secondary | ICD-10-CM | POA: Insufficient documentation

## 2011-12-13 MED ORDER — METHYLPREDNISOLONE SODIUM SUCC 125 MG IJ SOLR
40.0000 mg | Freq: Every day | INTRAMUSCULAR | Status: DC
  Administered 2011-12-13: 40 mg via INTRAVENOUS
  Filled 2011-12-13: qty 2

## 2011-12-13 MED ORDER — DIPHENHYDRAMINE HCL 50 MG/ML IJ SOLN
25.0000 mg | Freq: Once | INTRAMUSCULAR | Status: DC
  Administered 2011-12-13: 25 mg via INTRAVENOUS

## 2011-12-13 MED ORDER — DIPHENHYDRAMINE HCL 50 MG/ML IJ SOLN
25.0000 mg | Freq: Every day | INTRAMUSCULAR | Status: DC
  Filled 2011-12-13: qty 1

## 2011-12-13 MED ORDER — SODIUM CHLORIDE 0.9 % IV SOLN
200.0000 mg | INTRAVENOUS | Status: DC
  Administered 2011-12-13: 200 mg via INTRAVENOUS
  Filled 2011-12-13: qty 20

## 2011-12-13 NOTE — Progress Notes (Signed)
Pt states she had an approximately 3 minute episode of chest tightness nausea and feeling hot and flushed.  Pt had already had the episode and reports being symptom free when I checked on her.  Gavin Potters, dr Oletta Lamas med asst advised and she states she will advise him and then call me with further instructions or plan for the patient.

## 2012-02-04 ENCOUNTER — Other Ambulatory Visit (HOSPITAL_COMMUNITY): Payer: Self-pay | Admitting: *Deleted

## 2012-02-07 ENCOUNTER — Encounter (HOSPITAL_COMMUNITY)
Admission: RE | Admit: 2012-02-07 | Discharge: 2012-02-07 | Disposition: A | Discharge status: Home / Self Care | Payer: BC Managed Care – PPO | Source: Ambulatory Visit | Attending: Gastroenterology | Admitting: Gastroenterology

## 2012-02-07 DIAGNOSIS — K5090 Crohn's disease, unspecified, without complications: Secondary | ICD-10-CM | POA: Insufficient documentation

## 2012-02-07 MED ORDER — DIPHENHYDRAMINE HCL 50 MG/ML IJ SOLN
25.0000 mg | Freq: Every day | INTRAMUSCULAR | Status: AC
  Administered 2012-02-07: 25 mg via INTRAVENOUS
  Filled 2012-02-07: qty 1

## 2012-02-07 MED ORDER — METHYLPREDNISOLONE SODIUM SUCC 125 MG IJ SOLR
40.0000 mg | Freq: Every day | INTRAMUSCULAR | Status: AC
Start: 2012-02-07 — End: 2012-02-07
  Administered 2012-02-07: 40 mg via INTRAVENOUS

## 2012-02-07 MED ORDER — METHYLPREDNISOLONE SODIUM SUCC 40 MG IJ SOLR
INTRAMUSCULAR | Status: AC
  Filled 2012-02-07: qty 1

## 2012-02-07 MED ORDER — INFLIXIMAB 100 MG IV SOLR
200.0000 mg | INTRAVENOUS | Status: AC
  Administered 2012-02-07: 200 mg via INTRAVENOUS
  Filled 2012-02-07: qty 20

## 2012-02-07 NOTE — Progress Notes (Signed)
Pt. Now sleeping without noted distress; Remicade infusing at 125 ml/hr

## 2012-02-07 NOTE — Progress Notes (Signed)
Pt. C/o nausea, face flushed , chest tight; BP 99/64 HR 71; Remicade currently infusing at 80 ml;  Episode lasted approx. 15 minutes.

## 2012-02-07 NOTE — Progress Notes (Signed)
Remicade started at 10 ml/hr

## 2012-02-07 NOTE — Progress Notes (Signed)
Remicade increased to 20 ml/hr

## 2012-02-07 NOTE — Progress Notes (Signed)
Remicade increased to 40 ml/hr

## 2012-02-07 NOTE — Progress Notes (Signed)
Remicade increased 80 ml/hr

## 2012-03-21 ENCOUNTER — Other Ambulatory Visit: Payer: Self-pay | Admitting: Gastroenterology

## 2012-04-02 ENCOUNTER — Other Ambulatory Visit (HOSPITAL_COMMUNITY): Payer: Self-pay | Admitting: *Deleted

## 2012-04-03 ENCOUNTER — Encounter (HOSPITAL_COMMUNITY)
Admission: RE | Admit: 2012-04-03 | Discharge: 2012-04-03 | Disposition: A | Discharge status: Home / Self Care | Payer: BC Managed Care – PPO | Source: Ambulatory Visit | Attending: Gastroenterology | Admitting: Gastroenterology

## 2012-04-03 DIAGNOSIS — K5090 Crohn's disease, unspecified, without complications: Secondary | ICD-10-CM | POA: Insufficient documentation

## 2012-04-03 MED ORDER — METHYLPREDNISOLONE SODIUM SUCC 125 MG IJ SOLR
40.0000 mg | Freq: Once | INTRAMUSCULAR | Status: AC
  Administered 2012-04-03: 40 mg via INTRAVENOUS
  Filled 2012-04-03: qty 2

## 2012-04-03 MED ORDER — SODIUM CHLORIDE 0.9 % IV SOLN
INTRAVENOUS | Status: DC
  Administered 2012-04-03: 250 mL via INTRAVENOUS

## 2012-04-03 MED ORDER — SODIUM CHLORIDE 0.9 % IV SOLN
200.0000 mg | INTRAVENOUS | Status: DC
  Administered 2012-04-03: 200 mg via INTRAVENOUS
  Filled 2012-04-03: qty 20

## 2012-04-03 MED ORDER — DIPHENHYDRAMINE HCL 50 MG/ML IJ SOLN
25.0000 mg | Freq: Once | INTRAMUSCULAR | Status: AC
  Administered 2012-04-03: 25 mg via INTRAVENOUS
  Filled 2012-04-03: qty 1

## 2012-04-03 NOTE — Progress Notes (Addendum)
1030 no complaints 1050 patient complained of fullness in throat, palpitation, and nausea.  remicade infusion stopped and Dr Oletta Lamas advised.  He orders to continue infusion but at a slower rate.  Patient states she has had the same symptoms with the past 2 infusions but they are not bad this time.  Denies nausea at this time. 1125 no complaints 1300 resting without complaints

## 2012-05-01 ENCOUNTER — Encounter (HOSPITAL_COMMUNITY): Payer: BC Managed Care – PPO

## 2012-05-23 ENCOUNTER — Other Ambulatory Visit (HOSPITAL_COMMUNITY): Payer: Self-pay | Admitting: *Deleted

## 2012-05-23 MED ORDER — METHYLPREDNISOLONE SODIUM SUCC 125 MG IJ SOLR: 40.0000 mg | Freq: Once | INTRAMUSCULAR | Status: DC

## 2012-05-23 MED ORDER — SODIUM CHLORIDE 0.9 % IV SOLN: 200.0000 mg | INTRAVENOUS | Status: DC

## 2012-05-23 MED ORDER — DIPHENHYDRAMINE HCL 50 MG/ML IJ SOLN: 25.0000 mg | Freq: Once | INTRAMUSCULAR | Status: DC

## 2012-05-23 MED ORDER — SODIUM CHLORIDE 0.9 % IV SOLN: INTRAVENOUS | Status: DC

## 2012-05-29 ENCOUNTER — Encounter (HOSPITAL_COMMUNITY): Payer: BC Managed Care – PPO

## 2012-05-30 ENCOUNTER — Encounter (HOSPITAL_COMMUNITY)
Admission: RE | Admit: 2012-05-30 | Discharge: 2012-05-30 | Disposition: A | Discharge status: Home / Self Care | Payer: BC Managed Care – PPO | Source: Ambulatory Visit | Attending: Gastroenterology | Admitting: Gastroenterology

## 2012-05-30 DIAGNOSIS — K5090 Crohn's disease, unspecified, without complications: Secondary | ICD-10-CM | POA: Insufficient documentation

## 2012-05-30 MED ORDER — METHYLPREDNISOLONE SODIUM SUCC 125 MG IJ SOLR
40.0000 mg | INTRAMUSCULAR | Status: DC
  Administered 2012-05-30: 40 mg via INTRAVENOUS
  Filled 2012-05-30: qty 2

## 2012-05-30 MED ORDER — SODIUM CHLORIDE 0.9 % IV SOLN
INTRAVENOUS | Status: DC
  Administered 2012-05-30: 09:00:00 via INTRAVENOUS

## 2012-05-30 MED ORDER — HYDROMORPHONE HCL PF 1 MG/ML IJ SOLN
INTRAMUSCULAR | Status: AC
  Filled 2012-05-30: qty 1

## 2012-05-30 MED ORDER — INFLIXIMAB 100 MG IV SOLR
250.0000 mg | INTRAVENOUS | Status: DC
  Administered 2012-05-30: 250 mg via INTRAVENOUS
  Filled 2012-05-30: qty 25

## 2012-05-30 MED ORDER — DIPHENHYDRAMINE HCL 50 MG/ML IJ SOLN
25.0000 mg | INTRAMUSCULAR | Status: DC
  Administered 2012-05-30: 25 mg via INTRAVENOUS
  Filled 2012-05-30: qty 1

## 2012-05-30 MED ORDER — SODIUM CHLORIDE 0.9 % IV SOLN: 250.0000 mg | INTRAVENOUS | Status: DC

## 2012-07-24 ENCOUNTER — Other Ambulatory Visit (HOSPITAL_COMMUNITY): Payer: Self-pay | Admitting: *Deleted

## 2012-07-25 ENCOUNTER — Encounter (HOSPITAL_COMMUNITY)
Admission: RE | Admit: 2012-07-25 | Discharge: 2012-07-25 | Disposition: A | Discharge status: Home / Self Care | Payer: BC Managed Care – PPO | Source: Ambulatory Visit | Attending: Gastroenterology | Admitting: Gastroenterology

## 2012-07-25 DIAGNOSIS — K5090 Crohn's disease, unspecified, without complications: Secondary | ICD-10-CM | POA: Insufficient documentation

## 2012-07-25 MED ORDER — METHYLPREDNISOLONE SODIUM SUCC 40 MG IJ SOLR
40.0000 mg | INTRAMUSCULAR | Status: DC
  Administered 2012-07-25: 40 mg via INTRAVENOUS

## 2012-07-25 MED ORDER — SODIUM CHLORIDE 0.9 % IV SOLN
INTRAVENOUS | Status: DC
  Administered 2012-07-25: 09:00:00 via INTRAVENOUS

## 2012-07-25 MED ORDER — SODIUM CHLORIDE 0.9 % IV SOLN
300.0000 mg | Freq: Once | INTRAVENOUS | Status: AC
  Administered 2012-07-25: 300 mg via INTRAVENOUS
  Filled 2012-07-25: qty 30

## 2012-07-25 MED ORDER — DIPHENHYDRAMINE HCL 50 MG/ML IJ SOLN
INTRAMUSCULAR | Status: AC
  Administered 2012-07-25: 25 mg via INTRAVENOUS
  Filled 2012-07-25: qty 1

## 2012-07-25 MED ORDER — SODIUM CHLORIDE 0.9 % IV SOLN
250.0000 mg | INTRAVENOUS | Status: DC
  Filled 2012-07-25 (×2): qty 30

## 2012-07-25 MED ORDER — METHYLPREDNISOLONE SODIUM SUCC 40 MG IJ SOLR
INTRAMUSCULAR | Status: AC
  Administered 2012-07-25: 40 mg via INTRAVENOUS
  Filled 2012-07-25: qty 1

## 2012-07-25 MED ORDER — DIPHENHYDRAMINE HCL 50 MG/ML IJ SOLN
25.0000 mg | INTRAMUSCULAR | Status: DC
Start: 2012-07-25 — End: 2012-07-26
  Administered 2012-07-25: 25 mg via INTRAVENOUS

## 2012-09-19 ENCOUNTER — Encounter (HOSPITAL_COMMUNITY)
Admission: RE | Admit: 2012-09-19 | Discharge: 2012-09-19 | Disposition: A | Discharge status: Home / Self Care | Payer: BC Managed Care – PPO | Source: Ambulatory Visit | Attending: Gastroenterology | Admitting: Gastroenterology

## 2012-09-19 DIAGNOSIS — K5090 Crohn's disease, unspecified, without complications: Secondary | ICD-10-CM | POA: Insufficient documentation

## 2012-09-19 MED ORDER — DIPHENHYDRAMINE HCL 50 MG/ML IJ SOLN
INTRAMUSCULAR | Status: AC
  Filled 2012-09-19: qty 1

## 2012-09-19 MED ORDER — SODIUM CHLORIDE 0.9 % IV SOLN
INTRAVENOUS | Status: AC
  Administered 2012-09-19: 10:00:00 via INTRAVENOUS

## 2012-09-19 MED ORDER — METHYLPREDNISOLONE SODIUM SUCC 40 MG IJ SOLR
40.0000 mg | INTRAMUSCULAR | Status: AC
  Administered 2012-09-19: 40 mg via INTRAVENOUS

## 2012-09-19 MED ORDER — METHYLPREDNISOLONE SODIUM SUCC 40 MG IJ SOLR
INTRAMUSCULAR | Status: AC
  Filled 2012-09-19: qty 1

## 2012-09-19 MED ORDER — SODIUM CHLORIDE 0.9 % IV SOLN
300.0000 mg | INTRAVENOUS | Status: DC
  Administered 2012-09-19: 300 mg via INTRAVENOUS
  Filled 2012-09-19: qty 30

## 2012-09-19 MED ORDER — SODIUM CHLORIDE 0.9 % IV SOLN
250.0000 mg | INTRAVENOUS | Status: DC
  Filled 2012-09-19: qty 30

## 2012-09-19 MED ORDER — DIPHENHYDRAMINE HCL 50 MG/ML IJ SOLN
25.0000 mg | INTRAMUSCULAR | Status: AC
Start: 2012-09-19 — End: 2012-09-19
  Administered 2012-09-19: 25 mg via INTRAVENOUS

## 2012-09-19 NOTE — Progress Notes (Signed)
Patient states she is back to normal now and that this happens every time she receives Remicade

## 2012-09-19 NOTE — Progress Notes (Signed)
Remicade restarted at 20 ml/hr

## 2012-09-19 NOTE — Progress Notes (Signed)
Redness decreased, patient now c/o nausea.

## 2012-09-19 NOTE — Progress Notes (Signed)
Patient c/o Remicade reaction. Found patient with red face, c/o tightness in chest, denies itching. Remicade stopped,NS started.

## 2012-11-13 ENCOUNTER — Other Ambulatory Visit (HOSPITAL_COMMUNITY): Payer: Self-pay | Admitting: *Deleted

## 2012-11-14 ENCOUNTER — Encounter (HOSPITAL_COMMUNITY)
Admission: RE | Admit: 2012-11-14 | Discharge: 2012-11-14 | Disposition: A | Discharge status: Home / Self Care | Payer: BC Managed Care – PPO | Source: Ambulatory Visit | Attending: Gastroenterology | Admitting: Gastroenterology

## 2012-11-14 DIAGNOSIS — K5090 Crohn's disease, unspecified, without complications: Secondary | ICD-10-CM | POA: Insufficient documentation

## 2012-11-14 MED ORDER — METHYLPREDNISOLONE SODIUM SUCC 40 MG IJ SOLR
40.0000 mg | INTRAMUSCULAR | Status: DC
  Administered 2012-11-14: 40 mg via INTRAVENOUS

## 2012-11-14 MED ORDER — DIPHENHYDRAMINE HCL 50 MG/ML IJ SOLN
25.0000 mg | INTRAMUSCULAR | Status: DC
  Administered 2012-11-14: 25 mg via INTRAVENOUS

## 2012-11-14 MED ORDER — METHYLPREDNISOLONE SODIUM SUCC 40 MG IJ SOLR
INTRAMUSCULAR | Status: AC
  Administered 2012-11-14: 40 mg via INTRAVENOUS
  Filled 2012-11-14: qty 1

## 2012-11-14 MED ORDER — DIPHENHYDRAMINE HCL 50 MG/ML IJ SOLN
INTRAMUSCULAR | Status: AC
  Administered 2012-11-14: 25 mg via INTRAVENOUS
  Filled 2012-11-14: qty 1

## 2012-11-14 MED ORDER — SODIUM CHLORIDE 0.9 % IV SOLN
INTRAVENOUS | Status: DC
  Administered 2012-11-14: 09:00:00 via INTRAVENOUS

## 2012-11-14 MED ORDER — SODIUM CHLORIDE 0.9 % IV SOLN
300.0000 mg | INTRAVENOUS | Status: DC
  Administered 2012-11-14: 300 mg via INTRAVENOUS
  Filled 2012-11-14: qty 30

## 2012-11-14 NOTE — Progress Notes (Signed)
After increasing rate to 67m/hr, patient started to experience chest pain, shortness of breath, and became flushed.  Remicade stopped and NS flushed.  Patient states that this happens every time and that this is the last dose of Remicade because MD is switching her medications.  After about 15 minutes, patient stated that she felt better and could resume Remicade at a slower rate.  Patient will stay at 645mhr for rest of medication. Patient states that she feels fine after resuming medication.

## 2012-12-19 ENCOUNTER — Other Ambulatory Visit: Payer: Self-pay

## 2013-09-28 ENCOUNTER — Other Ambulatory Visit: Payer: Self-pay | Admitting: Family Medicine

## 2013-09-28 ENCOUNTER — Other Ambulatory Visit (HOSPITAL_COMMUNITY)
Admission: RE | Admit: 2013-09-28 | Discharge: 2013-09-28 | Disposition: A | Discharge status: Home / Self Care | Payer: BC Managed Care – PPO | Source: Ambulatory Visit | Attending: Family Medicine | Admitting: Family Medicine

## 2013-09-28 DIAGNOSIS — Z124 Encounter for screening for malignant neoplasm of cervix: Secondary | ICD-10-CM | POA: Insufficient documentation

## 2013-09-28 DIAGNOSIS — Z1151 Encounter for screening for human papillomavirus (HPV): Secondary | ICD-10-CM | POA: Insufficient documentation

## 2014-02-12 ENCOUNTER — Encounter: Payer: Self-pay | Admitting: Interventional Cardiology

## 2014-03-22 ENCOUNTER — Ambulatory Visit: Payer: BC Managed Care – PPO | Admitting: Interventional Cardiology

## 2014-03-27 ENCOUNTER — Other Ambulatory Visit: Payer: Self-pay | Admitting: Interventional Cardiology

## 2014-04-23 ENCOUNTER — Encounter: Payer: Self-pay | Admitting: Interventional Cardiology

## 2014-04-23 ENCOUNTER — Ambulatory Visit (INDEPENDENT_AMBULATORY_CARE_PROVIDER_SITE_OTHER): Payer: BC Managed Care – PPO | Admitting: Interventional Cardiology

## 2014-04-23 VITALS — BP 118/68 | HR 65 | Ht 63.0 in | Wt 149.1 lb

## 2014-04-23 DIAGNOSIS — I4729 Other ventricular tachycardia: Secondary | ICD-10-CM

## 2014-04-23 DIAGNOSIS — I472 Ventricular tachycardia, unspecified: Secondary | ICD-10-CM

## 2014-04-23 NOTE — Progress Notes (Signed)
Patient ID: Cheryl Ferguson, female   DOB: 1956/08/25, 58 y.o.   MRN: [id number]    [place], [address] [place], [place]  [address] Phone: [phone] Fax:  [phone]  Date:  04/23/2014   ID:  Cheryl Ferguson, DOB [birth year], MRN [id number]  PCP:  No primary provider on file.      History of Present Illness: Cheryl Ferguson is a 58 y.o. female who had VT in the past. SHe has had some shortlived palpitations after having some more caffeine than usual. It resolved off of caffeine. SHe has a normal LVEF. SHe has Crohns also and this has been under control. At times, she has had some low BP readings which have been in the 90s at times. This has stabilized for the most part. No dizziness. Nexium was stopped and potassium and Mg normalized. Rare episodes of palpitations lasting only a few seconds (q 3-4 months).  Ovreall, she is doing well nad not interested in changing her medicine regimen at this time.    Wt Readings from Last 3 Encounters:  04/23/14 149 lb 1.9 oz (67.64 kg)  11/14/12 141 lb (63.957 kg)  09/19/12 141 lb (63.957 kg)     No past medical history on file.  Current Outpatient Prescriptions  Medication Sig Dispense Refill  . B-12, Methylcobalamin, 1000 MCG SUBL Inject as directed every 30 (thirty) days. Pt unsure of dosage given.      Marland Kitchen HUMIRA PEN 40 MG/0.8ML PNKT       . PENTASA 500 MG CR capsule       . verapamil (CALAN-SR) 240 MG CR tablet TAKE 1 TABLET DAILY IN THE MORNING WITH FOOD  90 tablet  0   No current facility-administered medications for this visit.    Allergies:    Allergies  Allergen Reactions  . Entocort Ec [Budesonide] Other (See Comments)    Lowers Magnesium level  . Codeine Nausea Only  . Esomeprazole Magnesium     Drops magnesium and potassium   . Zolpidem Tartrate Other (See Comments)    Sleep walking   . Cephalexin Rash    Social History:  The patient  reports that she has never smoked. She does not have any smokeless tobacco history on  file.   Family History:  The patient's family history includes Hypertension in her father.   ROS:  Please see the history of present illness.  No nausea, vomiting.  No fevers, chills.  No focal weakness.  No dysuria.  All other systems reviewed and negative.   PHYSICAL EXAM: VS:  BP 118/68  Pulse 65  Ht 5' 3"  (1.6 m)  Wt 149 lb 1.9 oz (67.64 kg)  BMI 26.42 kg/m2 Well nourished, well developed, in no acute distress HEENT: normal Neck: no JVD, no carotid bruits Cardiac:  normal S1, S2; RRR;  Lungs:  clear to auscultation bilaterally, no wheezing, rhonchi or rales Abd: soft, nontender, no hepatomegaly Ext: no edema Skin: warm and dry Neuro:   no focal abnormalities noted  EKG:  NSR, NSST     ASSESSMENT AND PLAN:  Paroxysmal ventricular tachycardia  Refill Verapamil HCl Tablet Extended Release, 240 MG, 1 tablet every morning with food, Orally, Once a day, 90 days, 90, Refills 3 IMAGING: EKG    Harward,Amy 03/24/2013 11:57:40 AM > Zitlali Primm,JAY 03/24/2013 12:27:17 PM > NSR, NSST, no change   Notes: Sx controlled. No sustained palpitations. magnesium level followed by GI. Last reading was 1.8. Electrolytes better off of Nexium. Discussed medication change  but we agreed to keep her on verapamil.   Preventive Medicine  Adult topics discussed:  Exercise: at least 30 minutes of aerobic exercise, 5 days a week.      Signed, Mina Marble, MD, [facility] 04/23/2014 6:30 PM

## 2014-04-23 NOTE — Patient Instructions (Signed)
Your physician recommends that you continue on your current medications as directed. Please refer to the Current Medication list given to you today.  Your physician wants you to follow-up in: 1 year with Dr. Varanasi. You will receive a reminder letter in the mail two months in advance. If you don't receive a letter, please call our office to schedule the follow-up appointment.  

## 2014-06-25 ENCOUNTER — Other Ambulatory Visit: Payer: Self-pay | Admitting: Interventional Cardiology

## 2014-09-13 ENCOUNTER — Other Ambulatory Visit: Payer: Self-pay | Admitting: Gastroenterology

## 2014-09-13 DIAGNOSIS — R1012 Left upper quadrant pain: Secondary | ICD-10-CM

## 2014-09-22 ENCOUNTER — Ambulatory Visit
Admission: RE | Admit: 2014-09-22 | Discharge: 2014-09-22 | Disposition: A | Discharge status: Home / Self Care | Payer: BC Managed Care – PPO | Source: Ambulatory Visit | Attending: Gastroenterology | Admitting: Gastroenterology

## 2014-09-22 ENCOUNTER — Encounter (INDEPENDENT_AMBULATORY_CARE_PROVIDER_SITE_OTHER): Payer: Self-pay

## 2014-09-22 DIAGNOSIS — R1012 Left upper quadrant pain: Secondary | ICD-10-CM

## 2014-10-13 ENCOUNTER — Emergency Department: Payer: Self-pay | Admitting: Student

## 2014-12-01 ENCOUNTER — Other Ambulatory Visit: Payer: Self-pay | Admitting: Interventional Cardiology

## 2015-04-21 ENCOUNTER — Other Ambulatory Visit: Payer: Self-pay | Admitting: Family Medicine

## 2015-05-02 ENCOUNTER — Ambulatory Visit (INDEPENDENT_AMBULATORY_CARE_PROVIDER_SITE_OTHER): Payer: BC Managed Care – PPO | Admitting: Interventional Cardiology

## 2015-05-02 ENCOUNTER — Encounter: Payer: Self-pay | Admitting: Interventional Cardiology

## 2015-05-02 VITALS — BP 110/82 | HR 64 | Ht 63.0 in | Wt 151.6 lb

## 2015-05-02 DIAGNOSIS — I4729 Other ventricular tachycardia: Secondary | ICD-10-CM

## 2015-05-02 DIAGNOSIS — I472 Ventricular tachycardia: Secondary | ICD-10-CM | POA: Diagnosis not present

## 2015-05-02 NOTE — Patient Instructions (Signed)
**Note De-Identified  Obfuscation** Medication Instructions:  Same  Labwork: None  Testing/Procedures: None  Follow-Up: Your physician recommends that you schedule a follow-up appointment in: as needed

## 2015-05-02 NOTE — Progress Notes (Signed)
Patient ID: Cheryl Ferguson, female   DOB: 1956-06-10, 59 y.o.   MRN: [id number]     Cardiology Office Note   Date:  05/02/2015   ID:  Munachimso Rigdon, DOB 08/20/1956, MRN [id number]  PCP:  Mayra Neer, MD    No chief complaint on file.  VT  Wt Readings from Last 3 Encounters:  05/02/15 151 lb 9.6 oz (68.765 kg)  04/23/14 149 lb 1.9 oz (67.64 kg)  11/14/12 141 lb (63.957 kg)       History of Present Illness: Cheryl Ferguson is a 59 y.o. female  who had VT in 2009.  SHe has a normal LVEF. SHe has Crohns also and this has been under control. No more low BP readings, which have been in the 90s at times. This has stabilized for the most part. No dizziness. Nexium was stopped and potassium and Mg normalized.  At that point, her palpitations (VT) resolved. Rare episodes of palpitations lasting only a few seconds (q 3-4 months) in the past, but since her last visit, she has not had much in the way of palpitations.  She doe snot exercise regularly and notes that when she does, her heart rate increases quickly, and it resolves quickly after she slows down.  There is no associated chest pain, lightheadedness or syncope. She is planning on retiring next year.  Ovreall, she is doing well nad not interested in changing her medicine regimen at this time. She is fine with following up with Dr. Brigitte Pulse and seeing [place] as needed.     History reviewed. No pertinent past medical history.  History reviewed. No pertinent past surgical history.   Current Outpatient Prescriptions  Medication Sig Dispense Refill  . B-12, Methylcobalamin, 1000 MCG SUBL Inject as directed every 30 (thirty) days. Pt unsure of dosage given.    Marland Kitchen HUMIRA PEN 40 MG/0.8ML PNKT     . PENTASA 500 MG CR capsule     . verapamil (CALAN-SR) 240 MG CR tablet TAKE 1 TABLET DAILY IN THE MORNING WITH FOOD 90 tablet 1   No current facility-administered medications for this visit.    Allergies:   Entocort ec; Codeine; Esomeprazole magnesium;  Zolpidem tartrate; and Cephalexin    Social History:  The patient  reports that she has never smoked. She does not have any smokeless tobacco history on file.   Family History:  The patient's *family history includes Hypertension in her father.    ROS:  Please see the history of present illness.   Otherwise, review of systems are positive for none.   All other systems are reviewed and negative.    PHYSICAL EXAM: VS:  BP 110/82 mmHg  Pulse 64  Ht 5' 3"  (1.6 m)  Wt 151 lb 9.6 oz (68.765 kg)  BMI 26.86 kg/m2 , BMI Body mass index is 26.86 kg/(m^2). GEN: Well nourished, well developed, in no acute distress HEENT: normal Neck: no JVD, carotid bruits, or masses Cardiac: RRR; no murmurs, rubs, or gallops,no edema  Respiratory:  clear to auscultation bilaterally, normal work of breathing GI: soft, nontender, nondistended, + BS MS: no deformity or atrophy Skin: warm and dry, no rash Neuro:  Strength and sensation are intact Psych: euthymic mood, full affect   EKG:   The ekg ordered today demonstrates NSR, NSST; no change compared to prior ECG in 2015   Recent Labs: No results found for requested labs within last 365 days.   Lipid Panel    Component Value Date/Time   CHOL * 01/22/2009  0555    203        ATP III CLASSIFICATION:  <200     mg/dL   Desirable  200-239  mg/dL   Borderline High  >=240    mg/dL   High          TRIG 92 01/22/2009 0555   HDL 86 01/22/2009 0555   CHOLHDL 2.4 01/22/2009 0555   VLDL 18 01/22/2009 0555   LDLCALC  01/22/2009 0555    99        Total Cholesterol/HDL:CHD Risk Coronary Heart Disease Risk Table                     Men   Women  1/2 Average Risk   3.4   3.3  Average Risk       5.0   4.4  2 X Average Risk   9.6   7.1  3 X Average Risk  23.4   11.0        Use the calculated Patient Ratio above and the CHD Risk Table to determine the patient's CHD Risk.        ATP III CLASSIFICATION (LDL):  <100     mg/dL   Optimal  100-129  mg/dL    Near or Above                    Optimal  130-159  mg/dL   Borderline  160-189  mg/dL   High  >190     mg/dL   Very High     Other studies Reviewed: Additional studies/ records that were reviewed today with results demonstrating: 2015 ECG as above.   ASSESSMENT AND PLAN:  1. Ventricular tachycardia: This has not been an issue since her electrolytes have been stable. Her electrolyte issue has been related to her Crohn's disease. This is now very well controlled with her current regimen. Her magnesium also decreased while she was taking Nexium. She now takes an H2 blocker for reflux as needed, and she has not had any issues with her magnesium. Her reflux is also well controlled. 2. Reviewed her lipids. She has a very high good cholesterol which makes the total number high. Would not initiate lipid-lowering therapy based on her total cholesterol. 3. Encouraged regular exercise. She needs to ease into this and not try to improve her stamina all at once. If she feels that her heart is racing with a particular exercise, she should slow down. I think the heart rate issue that she is feeling described above with exercise is more related to some mild deconditioning.   Current medicines are reviewed at length with the patient today.  The patient concerns regarding her medicines were addressed.  The following changes have been made:  No change  Labs/ tests ordered today include:  No orders of the defined types were placed in this encounter.    Recommend 150 minutes/week of aerobic exercise Low fat, low carb, high fiber diet recommended  Disposition:   FU in prn   Teresita Madura., MD  05/02/2015 2:03 [facility] [place], [place], [place]  [address] Phone: [phone]; Fax: [phone]

## 2015-12-09 ENCOUNTER — Other Ambulatory Visit: Payer: Self-pay | Admitting: *Deleted

## 2015-12-09 MED ORDER — VERAPAMIL HCL ER 240 MG PO TBCR: EXTENDED_RELEASE_TABLET | ORAL | Status: DC

## 2016-01-06 ENCOUNTER — Encounter: Payer: Self-pay | Admitting: Interventional Cardiology

## 2016-03-12 ENCOUNTER — Other Ambulatory Visit: Payer: Self-pay | Admitting: Gastroenterology

## 2017-03-12 ENCOUNTER — Other Ambulatory Visit: Payer: Self-pay | Admitting: Acute Care

## 2017-03-12 DIAGNOSIS — Z87891 Personal history of nicotine dependence: Secondary | ICD-10-CM

## 2017-03-27 ENCOUNTER — Ambulatory Visit (INDEPENDENT_AMBULATORY_CARE_PROVIDER_SITE_OTHER)
Admission: RE | Admit: 2017-03-27 | Discharge: 2017-03-27 | Disposition: A | Discharge status: Home / Self Care | Payer: BC Managed Care – PPO | Source: Ambulatory Visit | Attending: Acute Care | Admitting: Acute Care

## 2017-03-27 ENCOUNTER — Ambulatory Visit (INDEPENDENT_AMBULATORY_CARE_PROVIDER_SITE_OTHER): Payer: BC Managed Care – PPO | Admitting: Acute Care

## 2017-03-27 ENCOUNTER — Encounter: Payer: Self-pay | Admitting: Acute Care

## 2017-03-27 DIAGNOSIS — Z87891 Personal history of nicotine dependence: Secondary | ICD-10-CM | POA: Diagnosis not present

## 2017-03-27 NOTE — Progress Notes (Signed)
Shared Decision Making Visit Lung Cancer Screening Program [phone])   Eligibility:  Age 61 y.o.  Pack Years Smoking History Calculation 57-pack-year smoking history (# packs/per year x # years smoked)  Recent History of coughing up blood  no  Unexplained weight loss? no ( >Than 15 pounds within the last 6 months )  Prior History Lung / other cancer no (Diagnosis within the last 5 years already requiring surveillance chest CT Scans).  Smoking Status Former Smoker  Former Smokers: Years since quit: 8 years  Quit Date: 2010  Visit Components:  Discussion included one or more decision making aids. yes  Discussion included risk/benefits of screening. yes  Discussion included potential follow up diagnostic testing for abnormal scans. yes  Discussion included meaning and risk of over diagnosis. yes  Discussion included meaning and risk of False Positives. yes  Discussion included meaning of total radiation exposure. yes  Counseling Included:  Importance of adherence to annual lung cancer LDCT screening. yes  Impact of comorbidities on ability to participate in the program. yes  Ability and willingness to under diagnostic treatment. yes  Smoking Cessation Counseling:  Current Smokers:   Discussed importance of smoking cessation. NA Former smoker  Information about tobacco cessation classes and interventions provided to patient. yes  Patient provided with "ticket" for LDCT Scan. yes  Symptomatic Patient. no  Counseling  Diagnosis Code: Tobacco Use Z72.0  Asymptomatic Patient yes  Counseling (Intermediate counseling: > three minutes counseling) E7517  Former Smokers:   Discussed the importance of maintaining cigarette abstinence. yes  Diagnosis Code: Personal History of Nicotine Dependence. G01.749  Information about tobacco cessation classes and interventions provided to patient. Yes  Patient provided with "ticket" for LDCT Scan. yes  Written Order for  Lung Cancer Screening with LDCT placed in Epic. Yes (CT Chest Lung Cancer Screening Low Dose W/O CM) [id number] Z12.2-Screening of respiratory organs Z87.891-Personal history of nicotine dependence  I spent 25 minutes of face to face time with Mrs. Stegenga discussing the risks and benefits of lung cancer screening. We viewed a power point together that explained in detail the above noted topics. We took the time to pause the power point at intervals to allow for questions to be asked and answered to ensure understanding. We discussed that she had taken the single most powerful action possible to decrease her risk of developing lung cancer when she quit smoking. I counseled her to remain smoke free, and to contact me if she ever had the desire to smoke again so that I can provide resources and tools to help support the effort to remain smoke free. We discussed the time and location of the scan, and that either Doroteo Glassman RN or I will call with the results within  24-48 hours of receiving them. Ms. Pechacek has my card and contact information in the event she needs to speak with me, in addition to a copy of the power point we reviewed as a resource. The patient verbalized understanding of all of the above and had no further questions upon leaving the office.    I spent 2-3 minutes counseling on the importance of continue smoking abstinence during this visit.   We discussed that there has been a high incidence of coronary artery disease noted on these exams. I explained that this is a non-gated exam therefore degree or severity of disease cannot be determined. We discussed that the most important thing is that she is having her cholesterol checked on an annual basis by  her primary care provider. I explained that I will fax a copy of these results to her PCP for follow-up as she felt was clinically indicated. The patient verbalized understanding of the above and had no further questions at completion of the  meeting.      Magdalen Spatz, NP 03/27/2017

## 2017-04-08 ENCOUNTER — Telehealth: Payer: Self-pay | Admitting: Acute Care

## 2017-04-08 DIAGNOSIS — F17210 Nicotine dependence, cigarettes, uncomplicated: Secondary | ICD-10-CM

## 2017-04-10 NOTE — Telephone Encounter (Signed)
Pt informed of CT results per Sarah Groce, NP.  PT verbalized understanding.  Copy sent to PCP.  Order placed for 1 yr f/u CT.  

## 2017-04-10 NOTE — Telephone Encounter (Signed)
LMTC x 1  

## 2017-04-24 ENCOUNTER — Other Ambulatory Visit (HOSPITAL_COMMUNITY): Payer: Self-pay | Admitting: *Deleted

## 2017-04-25 ENCOUNTER — Ambulatory Visit (HOSPITAL_COMMUNITY)
Admission: RE | Admit: 2017-04-25 | Discharge: 2017-04-25 | Disposition: A | Discharge status: Home / Self Care | Payer: BC Managed Care – PPO | Source: Ambulatory Visit | Attending: Family Medicine | Admitting: Family Medicine

## 2017-04-25 DIAGNOSIS — D500 Iron deficiency anemia secondary to blood loss (chronic): Secondary | ICD-10-CM | POA: Insufficient documentation

## 2017-04-25 MED ORDER — FERUMOXYTOL INJECTION 510 MG/17 ML
510.0000 mg | INTRAVENOUS | Status: DC
  Administered 2017-04-25: 510 mg via INTRAVENOUS
  Filled 2017-04-25: qty 17

## 2017-04-25 NOTE — Discharge Instructions (Signed)

## 2017-05-02 ENCOUNTER — Ambulatory Visit (HOSPITAL_COMMUNITY)
Admission: RE | Admit: 2017-05-02 | Discharge: 2017-05-02 | Disposition: A | Discharge status: Home / Self Care | Payer: BC Managed Care – PPO | Source: Ambulatory Visit | Attending: Family Medicine | Admitting: Family Medicine

## 2017-05-02 DIAGNOSIS — D500 Iron deficiency anemia secondary to blood loss (chronic): Secondary | ICD-10-CM | POA: Insufficient documentation

## 2017-05-02 MED ORDER — SODIUM CHLORIDE 0.9 % IV SOLN
510.0000 mg | INTRAVENOUS | Status: DC
  Administered 2017-05-02: 510 mg via INTRAVENOUS
  Filled 2017-05-02: qty 17

## 2017-06-19 DIAGNOSIS — H8102 Meniere's disease, left ear: Secondary | ICD-10-CM | POA: Insufficient documentation

## 2017-12-23 ENCOUNTER — Encounter: Admit: 2017-12-23 | Discharge: 2017-12-23 | Payer: MEDICARE

## 2017-12-23 DIAGNOSIS — M9903 Segmental and somatic dysfunction of lumbar region: Principal | ICD-10-CM

## 2017-12-23 DIAGNOSIS — M5417 Radiculopathy, lumbosacral region: Secondary | ICD-10-CM

## 2018-03-27 ENCOUNTER — Other Ambulatory Visit (HOSPITAL_COMMUNITY)
Admission: RE | Admit: 2018-03-27 | Discharge: 2018-03-27 | Disposition: A | Discharge status: Home / Self Care | Payer: BC Managed Care – PPO | Source: Ambulatory Visit | Attending: Family Medicine | Admitting: Family Medicine

## 2018-03-27 ENCOUNTER — Other Ambulatory Visit: Payer: Self-pay | Admitting: Family Medicine

## 2018-03-27 DIAGNOSIS — Z124 Encounter for screening for malignant neoplasm of cervix: Secondary | ICD-10-CM | POA: Insufficient documentation

## 2018-03-28 ENCOUNTER — Ambulatory Visit (INDEPENDENT_AMBULATORY_CARE_PROVIDER_SITE_OTHER)
Admission: RE | Admit: 2018-03-28 | Discharge: 2018-03-28 | Disposition: A | Discharge status: Home / Self Care | Payer: BC Managed Care – PPO | Source: Ambulatory Visit | Attending: Acute Care | Admitting: Acute Care

## 2018-03-28 ENCOUNTER — Inpatient Hospital Stay: Admission: RE | Admit: 2018-03-28 | Payer: BC Managed Care – PPO | Source: Ambulatory Visit

## 2018-03-28 DIAGNOSIS — F17210 Nicotine dependence, cigarettes, uncomplicated: Secondary | ICD-10-CM

## 2018-04-01 LAB — CYTOLOGY - PAP
Diagnosis: NEGATIVE
HPV (WINDOPATH): NOT DETECTED

## 2018-04-03 ENCOUNTER — Other Ambulatory Visit: Payer: Self-pay | Admitting: Acute Care

## 2018-04-03 DIAGNOSIS — Z87891 Personal history of nicotine dependence: Secondary | ICD-10-CM

## 2018-04-03 DIAGNOSIS — Z122 Encounter for screening for malignant neoplasm of respiratory organs: Secondary | ICD-10-CM

## 2018-04-09 ENCOUNTER — Ambulatory Visit (INDEPENDENT_AMBULATORY_CARE_PROVIDER_SITE_OTHER): Payer: BC Managed Care – PPO

## 2018-04-09 ENCOUNTER — Encounter: Payer: Self-pay | Admitting: Podiatry

## 2018-04-09 ENCOUNTER — Ambulatory Visit: Payer: BC Managed Care – PPO | Admitting: Podiatry

## 2018-04-09 ENCOUNTER — Other Ambulatory Visit: Payer: Self-pay | Admitting: Podiatry

## 2018-04-09 DIAGNOSIS — M722 Plantar fascial fibromatosis: Secondary | ICD-10-CM

## 2018-04-09 NOTE — Patient Instructions (Signed)
Pre-Operative Instructions  Congratulations, you have decided to take an important step towards improving your quality of life.  You can be assured that the doctors and staff at [facility] will be with you every step of the way.  Here are some important things you should know:  1. Plan to be at the surgery center/hospital at least 1 (one) hour prior to your scheduled time, unless otherwise directed by the surgical center/hospital staff.  You must have a responsible adult accompany you, remain during the surgery and drive you home.  Make sure you have directions to the surgical center/hospital to ensure you arrive on time. 2. If you are having surgery at [facility] or [facility] hospitals, you will need a copy of your medical history and physical form from your family physician within one month prior to the date of surgery. We will give you a form for your primary physician to complete.  3. We make every effort to accommodate the date you request for surgery.  However, there are times where surgery dates or times have to be moved.  We will contact you as soon as possible if a change in schedule is required.   4. No aspirin/ibuprofen for one week before surgery.  If you are on aspirin, any non-steroidal anti-inflammatory medications (Mobic, Aleve, Ibuprofen) should not be taken seven (7) days prior to your surgery.  You make take Tylenol for pain prior to surgery.  5. Medications - If you are taking daily heart and blood pressure medications, seizure, reflux, allergy, asthma, anxiety, pain or diabetes medications, make sure you notify the surgery center/hospital before the day of surgery so they can tell you which medications you should take or avoid the day of surgery. 6. No food or drink after midnight the night before surgery unless directed otherwise by surgical center/hospital staff. 7. No alcoholic beverages 24-hours prior to surgery.  No smoking 24-hours prior or 24-hours after  surgery. 8. Wear loose pants or shorts. They should be loose enough to fit over bandages, boots, and casts. 9. Don't wear slip-on shoes. Sneakers are preferred. 10. Bring your boot with you to the surgery center/hospital.  Also bring crutches or a walker if your physician has prescribed it for you.  If you do not have this equipment, it will be provided for you after surgery. 11. If you have not been contacted by the surgery center/hospital by the day before your surgery, call to confirm the date and time of your surgery. 12. Leave-time from work may vary depending on the type of surgery you have.  Appropriate arrangements should be made prior to surgery with your employer. 13. Prescriptions will be provided immediately following surgery by your doctor.  Fill these as soon as possible after surgery and take the medication as directed. Pain medications will not be refilled on weekends and must be approved by the doctor. 14. Remove nail polish on the operative foot and avoid getting pedicures prior to surgery. 15. Wash the night before surgery.  The night before surgery wash the foot and leg well with water and the antibacterial soap provided. Be sure to pay special attention to beneath the toenails and in between the toes.  Wash for at least three (3) minutes. Rinse thoroughly with water and dry well with a towel.  Perform this wash unless told not to do so by your physician.  Enclosed: 1 Ice pack (please put in freezer the night before surgery)   1 Hibiclens skin cleaner     Pre-op instructions  If you have any questions regarding the instructions, please do not hesitate to call our office.  [place]: [address], [place], [place] [address] -- [phone]  [place]: [address] [place], [place] [address] -- [phone]  [place]: [address]  [place], [place] [address] -- [phone]  [place]: [address], [address], [place], [place] [address] -- [phone]  Website:  [email or web address] 

## 2018-04-14 NOTE — Progress Notes (Signed)
   HPI: 62 year old female presenting today as a new patient with a chief complaint of left foot pain that began 9 months ago. She reports associated nodules to the plantar aspect of the foot. She states the pain has worsened over the past couple of months. She has been seen by her PCP for the symptoms but was referred here for treatment. Walking and bearing weight increases the pain. Patient is here for further evaluation and treatment.   History reviewed. No pertinent past medical history.    Physical Exam: General: The patient is alert and oriented x3 in no acute distress.  Dermatology: Skin is warm, dry and supple bilateral lower extremities. Negative for open lesions or macerations.  Vascular: Palpable pedal pulses bilaterally. No edema or erythema noted. Capillary refill within normal limits.  Neurological: Epicritic and protective threshold grossly intact bilaterally.   Musculoskeletal Exam: Palpable nodule x 2 noted to the plantar medial longitudinal arch of the left foot. Pain with palpation also noted to the area. Range of motion within normal limits to all pedal and ankle joints bilateral. Muscle strength 5/5 in all groups bilateral.   Radiographic Exam:  Normal osseous mineralization. Joint spaces preserved. No fracture/dislocation/boney destruction.    Assessment: - plantar fibroma left x 2   Plan of Care:  1. Today the patient was evaluated. X-Rays reviewed.  2. Today we discussed the conservative versus surgical management of the presenting pathology. The patient opts for surgical management. All possible complications and details of the procedure were explained. All patient questions were answered. No guarantees were expressed or implied. 3. Authorization for surgery was initiated today. Surgery will consist of excision of plantar fibromas of the left foot.  4. Post op shoe dispensed.  5. Return to clinic one week post op.      Edrick Kins, DPM [facility]  Dr. Edrick Kins, DPM    [address], [place] [address]                Office [phone]  Fax [phone]

## 2018-05-01 ENCOUNTER — Encounter: Payer: Self-pay | Admitting: Podiatry

## 2018-05-01 DIAGNOSIS — D492 Neoplasm of unspecified behavior of bone, soft tissue, and skin: Secondary | ICD-10-CM | POA: Diagnosis not present

## 2018-05-07 ENCOUNTER — Encounter: Payer: Self-pay | Admitting: Podiatry

## 2018-05-07 ENCOUNTER — Ambulatory Visit (INDEPENDENT_AMBULATORY_CARE_PROVIDER_SITE_OTHER): Payer: BC Managed Care – PPO | Admitting: Podiatry

## 2018-05-07 DIAGNOSIS — M722 Plantar fascial fibromatosis: Secondary | ICD-10-CM

## 2018-05-07 DIAGNOSIS — Z9889 Other specified postprocedural states: Secondary | ICD-10-CM

## 2018-05-11 NOTE — Progress Notes (Signed)
   Subjective:  Patient presents today status post excision of plantar fibroma of the left foot. DOS: 05/01/18. She states she is doing well. Walking causes some moderate pain. Resting helps alleviate it. She denies any new complaints. Patient is here for further evaluation and treatment.    History reviewed. No pertinent past medical history.    Objective/Physical Exam Neurovascular status intact.  Skin incisions appear to be well coapted with sutures and staples intact. No sign of infectious process noted. No dehiscence. No active bleeding noted. Moderate edema noted to the surgical extremity.   Assessment: 1. s/p excision of plantar fibroma of the left foot. DOS: 05/01/18   Plan of Care:  1. Patient was evaluated.  2. Dressing changed. Keep clean, dry and intact for one week.  3. Continue weightbearing in post op shoe.  4. Return to clinic in one week.    Edrick Kins, DPM [facility]  Dr. Edrick Kins, [address]                                        [place], [place] [address]                Office [phone]  Fax [phone]

## 2018-05-14 ENCOUNTER — Ambulatory Visit (INDEPENDENT_AMBULATORY_CARE_PROVIDER_SITE_OTHER): Payer: BC Managed Care – PPO | Admitting: Podiatry

## 2018-05-14 DIAGNOSIS — Z9889 Other specified postprocedural states: Secondary | ICD-10-CM

## 2018-05-14 DIAGNOSIS — M722 Plantar fascial fibromatosis: Secondary | ICD-10-CM

## 2018-05-18 NOTE — Progress Notes (Signed)
   Subjective:  Patient presents today status post excision of plantar fibroma of the left foot. DOS: 05/01/18. She states she is healing appropriately. She denies any complaints at this time. Patient is here for further evaluation and treatment.    No past medical history on file.    Objective/Physical Exam Neurovascular status intact.  Skin incisions appear to be well coapted with sutures and staples intact. No sign of infectious process noted. No dehiscence. No active bleeding noted. Moderate edema noted to the surgical extremity.   Assessment: 1. s/p excision of plantar fibroma of the left foot. DOS: 05/01/18   Plan of Care:  1. Patient was evaluated.  2. Dressing changed.  3. Transition into good sneakers.  4. Recommended antibiotic ointment daily with a Band-Aid.  5. Return to clinic in one week for suture removal.    Edrick Kins, DPM [facility]  Dr. Edrick Kins, [address]                                        [place], [place] [address]                Office [phone]  Fax [phone]

## 2018-05-21 ENCOUNTER — Ambulatory Visit (INDEPENDENT_AMBULATORY_CARE_PROVIDER_SITE_OTHER): Payer: BC Managed Care – PPO | Admitting: Podiatry

## 2018-05-21 DIAGNOSIS — M722 Plantar fascial fibromatosis: Secondary | ICD-10-CM

## 2018-05-21 DIAGNOSIS — Z9889 Other specified postprocedural states: Secondary | ICD-10-CM

## 2018-05-23 NOTE — Progress Notes (Signed)
DOS  05/01/2018  Excision of plantar fibroma left foot.

## 2018-05-31 NOTE — Progress Notes (Signed)
   Subjective:  Patient presents today status post excision of plantar fibroma of the left foot. DOS: 05/01/18. She states the foot has improved significantly. She denies any pain or other complaints at this time. Patient is here for further evaluation and treatment.    No past medical history on file.    Objective/Physical Exam Neurovascular status intact.  Skin incisions appear to be well coapted with sutures and staples intact. No sign of infectious process noted. No dehiscence. No active bleeding noted. Moderate edema noted to the surgical extremity.   Assessment: 1. s/p excision of plantar fibroma of the left foot. DOS: 05/01/18   Plan of Care:  1. Patient was evaluated.  2. Sutures removed.  3. Compression anklet dispensed.  4. Continue wearing good shoe gear.  5. Return to clinic in 3 weeks.    Edrick Kins, DPM [facility]  Dr. Edrick Kins, [address]                                        [place], [place] [address]                Office [phone]  Fax [phone]

## 2018-06-10 ENCOUNTER — Ambulatory Visit (INDEPENDENT_AMBULATORY_CARE_PROVIDER_SITE_OTHER): Payer: BC Managed Care – PPO | Admitting: Podiatry

## 2018-06-10 ENCOUNTER — Encounter: Payer: Self-pay | Admitting: Podiatry

## 2018-06-10 DIAGNOSIS — Z9889 Other specified postprocedural states: Secondary | ICD-10-CM

## 2018-06-10 DIAGNOSIS — M722 Plantar fascial fibromatosis: Secondary | ICD-10-CM

## 2018-06-10 MED ORDER — MELOXICAM 15 MG PO TABS: 15.0000 mg | ORAL_TABLET | Freq: Every day | ORAL | 1 refills | Status: AC

## 2018-06-15 ENCOUNTER — Other Ambulatory Visit: Payer: Self-pay

## 2018-06-15 ENCOUNTER — Emergency Department
Admission: EM | Admit: 2018-06-15 | Discharge: 2018-06-15 | Disposition: A | Discharge status: Home / Self Care | Payer: BC Managed Care – PPO | Attending: Emergency Medicine | Admitting: Emergency Medicine

## 2018-06-15 ENCOUNTER — Encounter: Payer: Self-pay | Admitting: Emergency Medicine

## 2018-06-15 ENCOUNTER — Emergency Department: Payer: BC Managed Care – PPO

## 2018-06-15 DIAGNOSIS — M79675 Pain in left toe(s): Secondary | ICD-10-CM | POA: Diagnosis present

## 2018-06-15 DIAGNOSIS — Z79899 Other long term (current) drug therapy: Secondary | ICD-10-CM | POA: Insufficient documentation

## 2018-06-15 DIAGNOSIS — Z87891 Personal history of nicotine dependence: Secondary | ICD-10-CM | POA: Diagnosis not present

## 2018-06-15 DIAGNOSIS — L03032 Cellulitis of left toe: Secondary | ICD-10-CM | POA: Insufficient documentation

## 2018-06-15 HISTORY — DX: Anemia, unspecified: D64.9

## 2018-06-15 HISTORY — DX: Non-pressure chronic ulcer of skin of other sites with unspecified severity: L98.499

## 2018-06-15 HISTORY — DX: Crohn's disease, unspecified, without complications: K50.90

## 2018-06-15 HISTORY — DX: Phlebitis and thrombophlebitis of unspecified site: I80.9

## 2018-06-15 HISTORY — DX: Anxiety disorder, unspecified: F41.9

## 2018-06-15 HISTORY — DX: Ventricular tachycardia: I47.2

## 2018-06-15 HISTORY — DX: Gastro-esophageal reflux disease without esophagitis: K21.9

## 2018-06-15 HISTORY — DX: Other seasonal allergic rhinitis: J30.2

## 2018-06-15 LAB — BASIC METABOLIC PANEL
Anion gap: 7 (ref 5–15)
BUN: 20 mg/dL (ref 8–23)
CHLORIDE: 112 mmol/L — AB (ref 98–111)
CO2: 24 mmol/L (ref 22–32)
CREATININE: 0.87 mg/dL (ref 0.44–1.00)
Calcium: 9 mg/dL (ref 8.9–10.3)
GFR calc Af Amer: >60 mL/min (ref >60)
Glucose, Bld: 103 mg/dL — ABNORMAL HIGH (ref 70–99)
Potassium: 2.9 mmol/L — ABNORMAL LOW (ref 3.5–5.1)
Sodium: 143 mmol/L (ref 135–145)

## 2018-06-15 LAB — CBC WITH DIFFERENTIAL/PLATELET
Basophils Absolute: 0 10*3/uL (ref 0–0.1)
Basophils Relative: 1 %
EOS ABS: 0.1 10*3/uL (ref 0–0.7)
EOS PCT: 1 %
HCT: 34.2 % — ABNORMAL LOW (ref 35.0–47.0)
HEMOGLOBIN: 11.8 g/dL — AB (ref 12.0–16.0)
LYMPHS ABS: 2.8 10*3/uL (ref 1.0–3.6)
Lymphocytes Relative: 31 %
MCH: 32.9 pg (ref 26.0–34.0)
MCHC: 34.5 g/dL (ref 32.0–36.0)
MCV: 95.2 fL (ref 80.0–100.0)
MONO ABS: 0.7 10*3/uL (ref 0.2–0.9)
MONOS PCT: 7 %
NEUTROS PCT: 60 %
Neutro Abs: 5.4 10*3/uL (ref 1.4–6.5)
Platelets: 258 10*3/uL (ref 150–440)
RBC: 3.59 MIL/uL — ABNORMAL LOW (ref 3.80–5.20)
RDW: 14 % (ref 11.5–14.5)
WBC: 9.1 10*3/uL (ref 3.6–11.0)

## 2018-06-15 MED ORDER — BACITRACIN ZINC 500 UNIT/GM EX OINT
TOPICAL_OINTMENT | Freq: Once | CUTANEOUS | Status: AC
  Administered 2018-06-15: 1 via TOPICAL
  Filled 2018-06-15: qty 0.9

## 2018-06-15 MED ORDER — CLINDAMYCIN HCL 300 MG PO CAPS: 300.0000 mg | ORAL_CAPSULE | Freq: Three times a day (TID) | ORAL | 0 refills | Status: AC

## 2018-06-15 MED ORDER — CLINDAMYCIN HCL 150 MG PO CAPS
300.0000 mg | ORAL_CAPSULE | Freq: Once | ORAL | Status: AC
  Administered 2018-06-15: 300 mg via ORAL
  Filled 2018-06-15: qty 2

## 2018-06-15 MED ORDER — POTASSIUM CHLORIDE CRYS ER 20 MEQ PO TBCR
40.0000 meq | EXTENDED_RELEASE_TABLET | Freq: Once | ORAL | Status: AC
  Administered 2018-06-15: 40 meq via ORAL
  Filled 2018-06-15: qty 2

## 2018-06-15 MED ORDER — LIDOCAINE-PRILOCAINE 2.5-2.5 % EX CREA
TOPICAL_CREAM | Freq: Once | CUTANEOUS | Status: AC
  Administered 2018-06-15: 1 via TOPICAL
  Filled 2018-06-15: qty 5

## 2018-06-15 NOTE — ED Triage Notes (Addendum)
Pt presents tonight with redness and swelling below nailbed on left great toe; throbbing pain; says she noticed some redness about a week ago and symptoms have progressed; pt adds she had 2 nodules. from plantar fibromatosis, removed on 5/23; saw her provider this past Tuesday for her last follow up visit and was given 2 injections in the bottom of her left foot on that day; swelling to the bottom of the foot started Wednesday and remains-sharp stabbing pain; no redness to bottom of foot; denies fever at home

## 2018-06-15 NOTE — ED Provider Notes (Signed)
[facility] Emergency Department Provider Note   ____________________________________________   First MD Initiated Contact with Patient 06/15/18 0145     (approximate)  I have reviewed the triage vital signs and the nursing notes.   HISTORY  Chief Complaint Foot Pain    HPI Cheryl Ferguson is a 62 y.o. female who comes into the hospital today with some left foot pain.  The patient states that she had surgery to her foot on May 23.  The patient had some knots taken off her foot by her podiatrist.  The patient states that she had a recent visit and she had been having some more pain so he gave her a shot in her foot.  She had also noticed some redness to her left big toe and he told her that it could be an ingrown toenail that she should watch it.  She reports that since she is had the injection she is noticed some more knots under her foot and the spot to her big toe has gotten more red.  The patient has pain below her foot but states that she has burning to her toe with some itching.  She was told that it could be an ingrown toenail but because the redness had spread and gotten worse she decided to come in and get checked out.  The patient denies any fevers.  She states that from the surgery she does have some decreased sensation to her foot.  The patient rates her pain a 5 out of 10 in intensity.   Past Medical History:  Diagnosis Date  . Anemia   . Anxiety   . Crohn disease ([place])   . GERD (gastroesophageal reflux disease)   . Phlebitis   . Seasonal allergies   . Ulcer of abdomen wall ([place])   . Ventricular tachycardia [facility])     Patient Active Problem List   Diagnosis Date Noted  . Paroxysmal ventricular tachycardia ([place]) 04/23/2014    Past Surgical History:  Procedure Laterality Date  . APPENDECTOMY    . BOWEL RESECTION    . CESAREAN SECTION    . FOOT SURGERY Right   . OVARIAN CYST REMOVAL    . TONSILLECTOMY      Prior to Admission medications    Medication Sig Start Date End Date Taking? Authorizing Provider  ALPRAZolam Duanne Moron) 0.5 MG tablet  04/04/18  Yes [provider]  B-12, Methylcobalamin, 1000 MCG SUBL Inject as directed every 30 (thirty) days. Pt unsure of dosage given.   Yes [provider]  famotidine (PEPCID) 20 MG tablet Take 20 mg by mouth 4 (four) times daily.   Yes [provider]  HUMIRA PEN 40 MG/0.8ML PNKT  04/02/14  Yes [provider]  meloxicam (MOBIC) 15 MG tablet Take 1 tablet (15 mg total) by mouth daily. 06/10/18 07/10/18 Yes Edrick Kins, DPM  montelukast (SINGULAIR) 10 MG tablet  01/17/18  Yes [provider]  PENTASA 500 MG CR capsule  02/26/14  Yes [provider]  verapamil (CALAN-SR) 240 MG CR tablet TAKE 1 TABLET DAILY IN THE MORNING WITH FOOD 12/09/15  Yes Jettie Booze, MD  clindamycin (CLEOCIN) 300 MG capsule Take 1 capsule (300 mg total) by mouth 3 (three) times daily for 10 days. 06/15/18 06/25/18  Loney Hering, MD    Allergies Remicade [infliximab]; Entocort ec [budesonide]; Codeine; Esomeprazole magnesium; Zolpidem tartrate; and Cephalexin  Family History  Problem Relation Age of Onset  . Hypertension Father  Social History Social History   Tobacco Use  . Smoking status: Former Smoker    Packs/day: 1.50    Years: 38.00    Pack years: 57.00    Types: Cigarettes    Last attempt to quit: 2010    Years since quitting: 9.5  . Smokeless tobacco: Never Used  Substance Use Topics  . Alcohol use: Never    Frequency: Never  . Drug use: Never    Review of Systems  Constitutional: No fever/chills Eyes: No visual changes. ENT: No sore throat. Cardiovascular: Denies chest pain. Respiratory: Denies shortness of breath. Gastrointestinal: No abdominal pain.  No nausea, no vomiting.  No diarrhea.  No constipation. Genitourinary: Negative for dysuria. Musculoskeletal: pain to left foot Skin: Redness to left great  toe Neurological: Negative for headaches, focal weakness or numbness.   ____________________________________________   PHYSICAL EXAM:  VITAL SIGNS: ED Triage Vitals  Enc Vitals Group     BP 06/15/18 0030 112/73     Pulse Rate 06/15/18 0030 75     Resp 06/15/18 0030 17     Temp 06/15/18 0030 97.7 F (36.5 C)     Temp Source 06/15/18 0030 Oral     SpO2 06/15/18 0030 99 %     Weight 06/15/18 0024 144 lb (65.3 kg)     Height 06/15/18 0024 5' 3"  (1.6 m)     Head Circumference --      Peak Flow --      Pain Score 06/15/18 0024 5     Pain Loc --      Pain Edu? --      Excl. in [place]? --     Constitutional: Alert and oriented. Well appearing and in mild distress. Eyes: Conjunctivae are normal. PERRL. EOMI. Head: Atraumatic. Nose: No congestion/rhinnorhea. Mouth/Throat: Mucous membranes are moist.  Oropharynx non-erythematous. Cardiovascular: Normal rate, regular rhythm. Grossly normal heart sounds.  Good peripheral circulation. Respiratory: Normal respiratory effort.  No retractions. Lungs CTAB. Gastrointestinal: Soft and nontender. No distention. Positive bowel sounds Musculoskeletal: No lower extremity tenderness nor edema.   Neurologic:  Normal speech and language.  Skin:  Skin is warm, dry and intact. Erythema to left great toe with an area of purulence along the nail bed.  Psychiatric: Mood and affect are normal.   ____________________________________________   LABS (all labs ordered are listed, but only abnormal results are displayed)  Labs Reviewed  CBC WITH DIFFERENTIAL/PLATELET - Abnormal; Notable for the following components:      Result Value   RBC 3.59 (*)    Hemoglobin 11.8 (*)    HCT 34.2 (*)    All other components within normal limits  BASIC METABOLIC PANEL - Abnormal; Notable for the following components:   Potassium 2.9 (*)    Chloride 112 (*)    Glucose, Bld 103 (*)    All other components within normal limits    ____________________________________________  EKG  none ____________________________________________  RADIOLOGY  ED MD interpretation:  Left foot xray: No acute bony abnormalities, soft tissues unremarkable, no bone erosion to suggest osteomyelitis  Official radiology report(s): Dg Foot Complete Left  Result Date: 06/15/2018 CLINICAL DATA:  Redness and swelling below the nail bed on the left great toe. First noticed about a week ago and progressed. EXAM: LEFT FOOT - COMPLETE 3+ VIEW COMPARISON:  04/09/2018 FINDINGS: There is no evidence of fracture or dislocation. There is no evidence of arthropathy or other focal bone abnormality. Soft tissues are unremarkable. IMPRESSION: No acute bony abnormalities.  Soft tissues are unremarkable. No bone erosion to suggest osteomyelitis. Electronically Signed   By: Lucienne Capers M.D.   On: 06/15/2018 02:48    ____________________________________________   PROCEDURES  Procedure(s) performed: please, see procedure note(s).  Marland Kitchen.Incision and Drainage Date/Time: 06/15/2018 3:15 AM Performed by: Loney Hering, MD Authorized by: Loney Hering, MD   Consent:    Consent obtained:  Verbal   Consent given by:  Patient   Risks discussed:  Bleeding, infection, incomplete drainage and pain   Alternatives discussed:  Alternative treatment, delayed treatment and observation Location:    Type:  Abscess   Location:  Lower extremity   Lower extremity location:  Toe   Toe location:  L big toe Pre-procedure details:    Skin preparation:  Chloraprep Anesthesia (see MAR for exact dosages):    Anesthesia method:  Topical application   Topical anesthetic:  EMLA cream Procedure type:    Complexity:  Simple Procedure details:    Incision types:  Single straight   Incision depth:  Dermal   Scalpel blade:  11   Drainage:  Purulent   Drainage amount:  Scant   Packing materials:  None Post-procedure details:    Patient tolerance of procedure:   Tolerated well, no immediate complications    Critical Care performed: No  ____________________________________________   INITIAL IMPRESSION / ASSESSMENT AND PLAN / ED COURSE  As part of my medical decision making, I reviewed the following data within the electronic MEDICAL RECORD NUMBER Notes from prior ED visits and [place] Controlled Substance Database   This is a 62 year old female who comes into the hospital today with some left toe redness and some left foot pain.  My differential diagnosis includes osteomyelitis, paronychia, cellulitis, abscess.  I did send the patient for an x-ray of her foot which did not show any bony erosion with a concern for osteomyelitis.  The patient did receive a CBC and a BMP which were also unremarkable aside from a potassium of 2.9. The patient appears to have cellulitis and a paronychia. She will be given some clindamycin and she will be discharged to home to follow up with her primary care physician.       ____________________________________________   FINAL CLINICAL IMPRESSION(S) / ED DIAGNOSES  Final diagnoses:  Paronychia of great toe of left foot  Cellulitis of toe of left foot     ED Discharge Orders        Ordered    clindamycin (CLEOCIN) 300 MG capsule  3 times daily     06/15/18 0325       Note:  This document was prepared using Dragon voice recognition software and may include unintentional dictation errors.    Loney Hering, MD 06/15/18 [phone]

## 2018-06-15 NOTE — Discharge Instructions (Addendum)
Please follow up with your podiatrist for further evaluation of your cellulitis and paronychia. Please return with any fevers or any other concerns

## 2018-06-16 NOTE — Progress Notes (Signed)
   Subjective:  Patient presents today status post excision of plantar fibroma of the left foot. DOS: 05/01/18. She reports minimal but and states she is doing well. She reports swelling everyday but notes it has improved from what it was previously. There are no modifying factors noted. Patient is here for further evaluation and treatment.    Past Medical History:  Diagnosis Date  . Anemia   . Anxiety   . Crohn disease ([place])   . GERD (gastroesophageal reflux disease)   . Phlebitis   . Seasonal allergies   . Ulcer of abdomen wall ([place])   . Ventricular tachycardia ([place])       Objective/Physical Exam Neurovascular status intact.  Skin incisions appear to be well coapted. No sign of infectious process noted. No dehiscence. No active bleeding noted. Moderate edema noted to the surgical extremity. Pain with palpation of the left plantar foot.    Assessment: 1. s/p excision of plantar fibroma of the left foot. DOS: 05/01/18 2. Scar tissue adhesions left plantar foot   Plan of Care:  1. Patient was evaluated.  2. Injection of 0.5 mLs Celestone Soluspan injected into the scar tissue to break up adhesions.  3. May resume full activity with no restrictions.  4. Recommended good shoe gear.  5. Return to clinic as needed.    Edrick Kins, DPM [facility]  Dr. Edrick Kins, [address]                                        [place], [place] [address]                Office [phone]  Fax [phone]

## 2018-06-17 ENCOUNTER — Ambulatory Visit: Payer: BC Managed Care – PPO | Admitting: Podiatry

## 2018-06-17 ENCOUNTER — Encounter: Payer: Self-pay | Admitting: Podiatry

## 2018-06-17 VITALS — BP 117/65 | HR 69 | Temp 97.7°F | Resp 18

## 2018-06-17 DIAGNOSIS — L309 Dermatitis, unspecified: Secondary | ICD-10-CM | POA: Diagnosis not present

## 2018-06-23 NOTE — Progress Notes (Signed)
   HPI: Patient presents today with a new complaint regarding her  left hallux.  Patient states that when she was here last time she started to notice a little bit of redness and over the past week it got significantly increased.  She also noticed some drainage coming from her left hallux as well.  She has been applying topical antibiotic ointment and wrapping it.  She says that her great toe feels burned.  She went to the emergency department over the weekend and they referred her back here for further treatment evaluation  Past Medical History:  Diagnosis Date  . Anemia   . Anxiety   . Crohn disease ([place])   . GERD (gastroesophageal reflux disease)   . Phlebitis   . Seasonal allergies   . Ulcer of abdomen wall ([place])   . Ventricular tachycardia [facility])      Physical Exam: General: The patient is alert and oriented x3 in no acute distress.  Dermatology: Erythema with some edema noted to the superficial skin of the left hallux consistent with a dermatitis, likely a contact dermatitis  Vascular: Palpable pedal pulses bilaterally. No edema or erythema noted. Capillary refill within normal limits.  Neurological: Epicritic and protective threshold grossly intact bilaterally.   Musculoskeletal Exam: Range of motion within normal limits to all pedal and ankle joints bilateral. Muscle strength 5/5 in all groups bilateral.   Assessment: 1.  Dermatitis left hallux, likely secondary to contact dermatitis   Plan of Care:  1. Patient evaluated.  Recommend that the patient continues oral clindamycin 2.  3 times daily from the emergency department 3.  Recommend Silvadene cream daily without bandaging, which may be contributing to the irritation and contact dermatitis 4.  Patient can continue Epsom salts soaks daily 5.  Return to clinic in 1 week if there is no improvement we may need to perform a nail avulsion to the left hallux      Edrick Kins, DPM [facility]  Dr. Edrick Kins, DPM    [address], [place] [address]                Office [phone]  Fax [phone]

## 2018-06-24 ENCOUNTER — Ambulatory Visit: Payer: BC Managed Care – PPO | Admitting: Podiatry

## 2018-06-24 ENCOUNTER — Encounter: Payer: Self-pay | Admitting: Podiatry

## 2018-06-24 DIAGNOSIS — L03032 Cellulitis of left toe: Secondary | ICD-10-CM

## 2018-06-24 NOTE — Progress Notes (Signed)
   Subjective: 62 year old female presenting today with for follow up evaluation of dermatitis of the left hallux. She states she is doing well with minimal pain. She denies any new concerns at this time. Patient is here for further evaluation and treatment.   Past Medical History:  Diagnosis Date  . Anemia   . Anxiety   . Crohn disease ([place])   . GERD (gastroesophageal reflux disease)   . Phlebitis   . Seasonal allergies   . Ulcer of abdomen wall ([place])   . Ventricular tachycardia ([place])     Objective:  General: Well developed, nourished, in no acute distress, alert and oriented x3   Dermatology: Abscess noted to the base of the nail plate of the left hallux    Vascular: Dorsalis Pedis artery and Posterior Tibial artery pedal pulses palpable. No lower extremity edema noted.   Neruologic: Grossly intact via light touch bilateral.  Musculoskeletal: Muscular strength within normal limits in all groups bilateral. Normal range of motion noted to all pedal and ankle joints.   Assessment:  1. Abscess of the left nail plate 2. Dermatitis left hallux - resolved   Plan of Care:  1. Patient evaluated.  2. Discussed treatment alternatives and plan of care. Explained nail avulsion procedure and post procedure course to patient. 3. Patient opted for total temporary nail avulsion.  4. Prior to procedure, local anesthesia infiltration utilized using 3 ml of a 50:50 mixture of 2% plain lidocaine and 0.5% plain marcaine in a normal hallux block fashion and a betadine prep performed.  5. Light dressing applied. 6. Continue Epsom salt soaks and Silvadene cream daily.  7. Return to clinic in 2 weeks.   Edrick Kins, DPM [facility]  Dr. Edrick Kins, [address]                                        [place],  [address]                Office [phone]  Fax [phone]

## 2018-06-24 NOTE — Patient Instructions (Signed)

## 2018-06-25 ENCOUNTER — Telehealth: Payer: Self-pay | Admitting: Podiatry

## 2018-06-25 MED ORDER — SILVER SULFADIAZINE 1 % EX CREA: 1.0000 "application " | TOPICAL_CREAM | Freq: Every day | CUTANEOUS | 1 refills | Status: DC

## 2018-06-25 NOTE — Telephone Encounter (Signed)
Returned patient call and left voice mail that silvadene cream has been sent to her pharmacy.

## 2018-06-25 NOTE — Telephone Encounter (Signed)
I'm returning a call to Angie about the silvadene cream. You talked about calling the [facility] on [address] and that one needs to be taken out. I use the [facility] in [place] on [address]. If you have gone ahead and called it in that is okay. I'll just check with the [facility] in [place] and ask them to transfer it over to theirs. If you haven't, it would be nice if you would call the one on [address] in [place]. Thank you very much. Bye.

## 2018-06-25 NOTE — Telephone Encounter (Signed)
Patient was told to use Silvadene cream and Dr Amalia Hailey didn't think it was something that needed to be prescribed but the pharmacy is requesting a prescription. Please call patient back at [phone].

## 2018-07-15 ENCOUNTER — Ambulatory Visit (INDEPENDENT_AMBULATORY_CARE_PROVIDER_SITE_OTHER): Payer: BC Managed Care – PPO | Admitting: Podiatry

## 2018-07-15 ENCOUNTER — Encounter: Payer: Self-pay | Admitting: Podiatry

## 2018-07-15 DIAGNOSIS — L03032 Cellulitis of left toe: Secondary | ICD-10-CM

## 2018-07-18 NOTE — Progress Notes (Signed)
   Subjective: Patient presents today 2 weeks post total nail avulsion procedure of the left hallux. Patient states that the toe and nail fold is feeling much better. Patient is here for further evaluation and treatment.   Past Medical History:  Diagnosis Date  . Anemia   . Anxiety   . Crohn disease ([place])   . GERD (gastroesophageal reflux disease)   . Phlebitis   . Seasonal allergies   . Ulcer of abdomen wall ([place])   . Ventricular tachycardia ([facility])     Objective: Skin is warm, dry and supple. Nail bed and respective nail fold appears to be healing appropriately. Open wound to the associated nail fold with a granular wound base and moderate amount of fibrotic tissue. Minimal drainage noted.  Assessment: #1 postop temporary total nail avulsion #2 open wound periungual nail fold and nail bed of respective digit.   Plan of care: #1 patient was evaluated  #2 debridement of open wound was performed to the periungual border and nail fold of the respective toe using a currette. Antibiotic ointment and Band-Aid was applied. #3 patient is to return to clinic on a PRN  Basis.  Has fishing ponds.    Edrick Kins, DPM [facility]  Dr. Edrick Kins, [address]                                        [place], [place] [address]                Office [phone]  Fax [phone]

## 2019-03-11 HISTORY — PX: COLONOSCOPY: SHX174

## 2019-03-19 ENCOUNTER — Other Ambulatory Visit: Payer: Self-pay | Admitting: Family Medicine

## 2019-03-19 ENCOUNTER — Other Ambulatory Visit: Payer: Self-pay

## 2019-03-19 ENCOUNTER — Ambulatory Visit
Admission: RE | Admit: 2019-03-19 | Discharge: 2019-03-19 | Disposition: A | Discharge status: Home / Self Care | Payer: BC Managed Care – PPO | Source: Ambulatory Visit | Attending: Family Medicine | Admitting: Family Medicine

## 2019-03-19 DIAGNOSIS — R109 Unspecified abdominal pain: Secondary | ICD-10-CM

## 2019-03-19 DIAGNOSIS — R3129 Other microscopic hematuria: Secondary | ICD-10-CM

## 2019-03-19 DIAGNOSIS — I700 Atherosclerosis of aorta: Secondary | ICD-10-CM

## 2019-03-19 HISTORY — DX: Atherosclerosis of aorta: I70.0

## 2019-04-10 ENCOUNTER — Emergency Department (HOSPITAL_COMMUNITY)
Admission: EM | Admit: 2019-04-10 | Discharge: 2019-04-10 | Disposition: A | Discharge status: Home / Self Care | Payer: BC Managed Care – PPO | Attending: Emergency Medicine | Admitting: Emergency Medicine

## 2019-04-10 ENCOUNTER — Encounter (HOSPITAL_COMMUNITY): Payer: Self-pay | Admitting: *Deleted

## 2019-04-10 ENCOUNTER — Other Ambulatory Visit: Payer: Self-pay

## 2019-04-10 ENCOUNTER — Emergency Department (HOSPITAL_COMMUNITY): Payer: BC Managed Care – PPO

## 2019-04-10 DIAGNOSIS — R10815 Periumbilic abdominal tenderness: Secondary | ICD-10-CM | POA: Diagnosis not present

## 2019-04-10 DIAGNOSIS — R112 Nausea with vomiting, unspecified: Secondary | ICD-10-CM | POA: Insufficient documentation

## 2019-04-10 DIAGNOSIS — R10811 Right upper quadrant abdominal tenderness: Secondary | ICD-10-CM | POA: Insufficient documentation

## 2019-04-10 DIAGNOSIS — R7989 Other specified abnormal findings of blood chemistry: Secondary | ICD-10-CM

## 2019-04-10 DIAGNOSIS — N201 Calculus of ureter: Secondary | ICD-10-CM | POA: Diagnosis not present

## 2019-04-10 DIAGNOSIS — Z79899 Other long term (current) drug therapy: Secondary | ICD-10-CM | POA: Diagnosis not present

## 2019-04-10 DIAGNOSIS — R1031 Right lower quadrant pain: Secondary | ICD-10-CM | POA: Diagnosis present

## 2019-04-10 DIAGNOSIS — R197 Diarrhea, unspecified: Secondary | ICD-10-CM | POA: Insufficient documentation

## 2019-04-10 DIAGNOSIS — Z87442 Personal history of urinary calculi: Secondary | ICD-10-CM

## 2019-04-10 DIAGNOSIS — N1330 Unspecified hydronephrosis: Secondary | ICD-10-CM | POA: Insufficient documentation

## 2019-04-10 DIAGNOSIS — N200 Calculus of kidney: Secondary | ICD-10-CM

## 2019-04-10 DIAGNOSIS — K5010 Crohn's disease of large intestine without complications: Secondary | ICD-10-CM

## 2019-04-10 DIAGNOSIS — N134 Hydroureter: Secondary | ICD-10-CM | POA: Diagnosis not present

## 2019-04-10 DIAGNOSIS — Z87891 Personal history of nicotine dependence: Secondary | ICD-10-CM | POA: Insufficient documentation

## 2019-04-10 HISTORY — DX: Personal history of urinary calculi: Z87.442

## 2019-04-10 LAB — COMPREHENSIVE METABOLIC PANEL
ALT: 21 U/L (ref 0–44)
AST: 27 U/L (ref 15–41)
Albumin: 3.5 g/dL (ref 3.5–5.0)
Alkaline Phosphatase: 73 U/L (ref 38–126)
Anion gap: 14 (ref 5–15)
BUN: 11 mg/dL (ref 8–23)
CO2: 19 mmol/L — ABNORMAL LOW (ref 22–32)
Calcium: 9.4 mg/dL (ref 8.9–10.3)
Chloride: 111 mmol/L (ref 98–111)
Creatinine, Ser: 1.6 mg/dL — ABNORMAL HIGH (ref 0.44–1.00)
GFR calc Af Amer: 39 mL/min — ABNORMAL LOW (ref >60)
GFR calc non Af Amer: 34 mL/min — ABNORMAL LOW (ref >60)
Glucose, Bld: 111 mg/dL — ABNORMAL HIGH (ref 70–99)
Potassium: 2.8 mmol/L — ABNORMAL LOW (ref 3.5–5.1)
Sodium: 144 mmol/L (ref 135–145)
Total Bilirubin: 0.6 mg/dL (ref 0.3–1.2)
Total Protein: 6.5 g/dL (ref 6.5–8.1)

## 2019-04-10 LAB — URINALYSIS, ROUTINE W REFLEX MICROSCOPIC
Bilirubin Urine: NEGATIVE
Glucose, UA: NEGATIVE mg/dL
Ketones, ur: NEGATIVE mg/dL
Nitrite: NEGATIVE
Protein, ur: NEGATIVE mg/dL
RBC / HPF: >50 RBC/hpf — ABNORMAL HIGH (ref 0–5)
Specific Gravity, Urine: 1.032 — ABNORMAL HIGH (ref 1.005–1.030)
pH: 5 (ref 5.0–8.0)

## 2019-04-10 LAB — LIPASE, BLOOD: Lipase: 47 U/L (ref 11–51)

## 2019-04-10 LAB — CBC
HCT: 39.4 % (ref 36.0–46.0)
Hemoglobin: 12.6 g/dL (ref 12.0–15.0)
MCH: 31 pg (ref 26.0–34.0)
MCHC: 32 g/dL (ref 30.0–36.0)
MCV: 97 fL (ref 80.0–100.0)
Platelets: 238 10*3/uL (ref 150–400)
RBC: 4.06 MIL/uL (ref 3.87–5.11)
RDW: 13.7 % (ref 11.5–15.5)
WBC: 6.9 10*3/uL (ref 4.0–10.5)
nRBC: 0 % (ref 0.0–0.2)

## 2019-04-10 MED ORDER — IOPAMIDOL (ISOVUE-300) INJECTION 61%
80.0000 mL | Freq: Once | INTRAVENOUS | Status: AC | PRN
  Administered 2019-04-10: 80 mL via INTRAVENOUS

## 2019-04-10 MED ORDER — SODIUM CHLORIDE 0.9 % IV BOLUS
500.0000 mL | Freq: Once | INTRAVENOUS | Status: AC
  Administered 2019-04-10: 09:00:00 500 mL via INTRAVENOUS

## 2019-04-10 MED ORDER — HYDROCODONE-ACETAMINOPHEN 5-325 MG PO TABS: 1.0000 | ORAL_TABLET | Freq: Four times a day (QID) | ORAL | 0 refills | Status: DC | PRN

## 2019-04-10 MED ORDER — ONDANSETRON HCL 4 MG/2ML IJ SOLN
4.0000 mg | Freq: Once | INTRAMUSCULAR | Status: AC
  Administered 2019-04-10: 4 mg via INTRAVENOUS
  Filled 2019-04-10: qty 2

## 2019-04-10 MED ORDER — MORPHINE SULFATE (PF) 4 MG/ML IV SOLN
4.0000 mg | Freq: Once | INTRAVENOUS | Status: AC
  Administered 2019-04-10: 4 mg via INTRAVENOUS
  Filled 2019-04-10: qty 1

## 2019-04-10 MED ORDER — POTASSIUM CHLORIDE CRYS ER 20 MEQ PO TBCR: 40.0000 meq | EXTENDED_RELEASE_TABLET | Freq: Once | ORAL | Status: DC

## 2019-04-10 MED ORDER — POTASSIUM CHLORIDE CRYS ER 20 MEQ PO TBCR
20.0000 meq | EXTENDED_RELEASE_TABLET | Freq: Once | ORAL | Status: AC
  Administered 2019-04-10: 09:00:00 20 meq via ORAL
  Filled 2019-04-10: qty 1

## 2019-04-10 MED ORDER — TAMSULOSIN HCL 0.4 MG PO CAPS: 0.4000 mg | ORAL_CAPSULE | Freq: Every day | ORAL | 0 refills | Status: DC

## 2019-04-10 MED ORDER — SODIUM CHLORIDE 0.9% FLUSH
3.0000 mL | Freq: Once | INTRAVENOUS | Status: AC
  Administered 2019-04-10: 3 mL via INTRAVENOUS

## 2019-04-10 MED ORDER — ONDANSETRON HCL 4 MG PO TABS: 4.0000 mg | ORAL_TABLET | Freq: Four times a day (QID) | ORAL | 0 refills | Status: DC

## 2019-04-10 NOTE — ED Notes (Signed)
Patient aware that we need urine sample for testing, unable at this time. Pt given instruction on providing urine sample when able to do so.

## 2019-04-10 NOTE — ED Triage Notes (Signed)
Pt reports onset of mid lower abdominal pain, right sided abdominal pain that radiates to the mid back. Onset around 2300. Associated with vomiting and diarrhea. No fevers. Hx of chron's disease. Had colonoscopy on Monday.

## 2019-04-10 NOTE — ED Provider Notes (Signed)
[facility] EMERGENCY DEPARTMENT Provider Note   CSN: [id number] Arrival date & time: 04/10/19  1324    History   Chief Complaint Chief Complaint  Patient presents with  . Abdominal Pain    HPI Cheryl Ferguson is a 63 y.o. female with a past medical history of Crohn's disease, GERD, who presents today for evaluation of abdominal pain.  She reports that she was feeling okay until around 11 PM when she started having right upper abdominal pain and midline lower abdominal pain.  Her pain goes into her back.  She denies any fevers.  She has had nausea with 3 episodes of vomiting, and approximately 5 episodes of small-volume diarrhea.  She reports that she was doing well after her colonoscopy.  She contacted on-call GI, her gastroenterologist is Dr. Oletta Lamas with [facility], who recommended that she come to the emergency room for a repeat scan.  She denies any recent trauma.  She has been on Flagyl, and did not take her home Zofran as she is unsure if it was okay to take this with the Flagyl.  She denies any dysuria, increased frequency or urgency.     HPI  Past Medical History:  Diagnosis Date  . Anemia   . Anxiety   . Crohn disease ([place])   . GERD (gastroesophageal reflux disease)   . Phlebitis   . Seasonal allergies   . Ulcer of abdomen wall ([place])   . Ventricular tachycardia [facility])     Patient Active Problem List   Diagnosis Date Noted  . Paroxysmal ventricular tachycardia ([place]) 04/23/2014    Past Surgical History:  Procedure Laterality Date  . APPENDECTOMY    . BOWEL RESECTION    . CESAREAN SECTION    . FOOT SURGERY Right   . OVARIAN CYST REMOVAL    . TONSILLECTOMY       OB History   No obstetric history on file.      Home Medications    Prior to Admission medications   Medication Sig Start Date End Date Taking? Authorizing Provider  ALPRAZolam Duanne Moron) 0.5 MG tablet  04/04/18   [provider]  B-12, Methylcobalamin, 1000 MCG SUBL Inject as  directed every 30 (thirty) days. Pt unsure of dosage given.    [provider]  famotidine (PEPCID) 20 MG tablet Take 20 mg by mouth 4 (four) times daily.    [provider]  HUMIRA PEN 40 MG/0.8ML PNKT  04/02/14   [provider]  montelukast (SINGULAIR) 10 MG tablet  01/17/18   [provider]  PENTASA 500 MG CR capsule  02/26/14   [provider]  Potassium Chloride ER 20 MEQ TBCR TAKE 1 TABLET BY MOUTH ONCE DAILY WITH FOOD 06/27/18   [provider]  silver sulfADIAZINE (SILVADENE) 1 % cream Apply 1 application topically daily. 06/25/18   Edrick Kins, DPM  verapamil (CALAN-SR) 240 MG CR tablet TAKE 1 TABLET DAILY IN THE MORNING WITH FOOD 12/09/15   Jettie Booze, MD    Family History Family History  Problem Relation Age of Onset  . Hypertension Father     Social History Social History   Tobacco Use  . Smoking status: Former Smoker    Packs/day: 1.50    Years: 38.00    Pack years: 57.00    Types: Cigarettes    Last attempt to quit: 2010    Years since quitting: 10.3  . Smokeless tobacco: Never Used  Substance Use Topics  .  Alcohol use: Never    Frequency: Never  . Drug use: Never     Allergies   Remicade [infliximab]; Entocort ec [budesonide]; Codeine; Esomeprazole magnesium; Zolpidem tartrate; and Cephalexin   Review of Systems Review of Systems  Constitutional: Negative for chills and fever.  HENT: Negative for congestion, sinus pressure and sinus pain.   Respiratory: Negative for chest tightness and shortness of breath.   Cardiovascular: Negative for chest pain and leg swelling.  Gastrointestinal: Positive for abdominal pain, diarrhea, nausea and vomiting.  Genitourinary: Negative for dysuria, frequency and urgency.  Musculoskeletal: Positive for back pain (Right sided).  Skin: Negative for color change and wound.  Neurological: Negative for weakness and headaches.  All other systems reviewed and are  negative.    Physical Exam Updated Vital Signs BP 124/72   Pulse 77   Temp 98.1 F (36.7 C)   Resp 20   SpO2 93%   Physical Exam Vitals signs and nursing note reviewed.  Constitutional:      Appearance: She is well-developed. She is not diaphoretic.     Comments: Appears uncomfortable.   HENT:     Head: Normocephalic and atraumatic.     Right Ear: External ear normal.     Left Ear: External ear normal.     Nose: Nose normal.  Eyes:     General: No scleral icterus.       Right eye: No discharge.        Left eye: No discharge.     Conjunctiva/sclera: Conjunctivae normal.     Pupils: Pupils are equal, round, and reactive to light.  Neck:     Musculoskeletal: Normal range of motion and neck supple.     Trachea: No tracheal deviation.  Cardiovascular:     Rate and Rhythm: Normal rate and regular rhythm.     Heart sounds: Normal heart sounds. No murmur. No friction rub. No gallop.   Pulmonary:     Effort: Pulmonary effort is normal. No respiratory distress.     Breath sounds: Normal breath sounds. No stridor. No wheezing.  Abdominal:     General: Abdomen is flat. Bowel sounds are normal. There is no distension.     Palpations: Abdomen is soft.     Tenderness: There is abdominal tenderness in the right upper quadrant, right lower quadrant and suprapubic area. There is right CVA tenderness. There is no guarding.  Musculoskeletal:        General: No deformity.     Comments: No superficial TTP over right sided back, diffuse R sided lower back/CVA tenderness to percussion.   Skin:    General: Skin is warm and dry.  Neurological:     General: No focal deficit present.     Mental Status: She is alert.     Cranial Nerves: No cranial nerve deficit.     Sensory: No sensory deficit.     Motor: No abnormal muscle tone.  Psychiatric:        Mood and Affect: Mood normal.        Behavior: Behavior normal.      ED Treatments / Results  Labs (all labs ordered are listed, but  only abnormal results are displayed) Labs Reviewed  COMPREHENSIVE METABOLIC PANEL - Abnormal; Notable for the following components:      Result Value   Potassium 2.8 (*)    CO2 19 (*)    Glucose, Bld 111 (*)    Creatinine, Ser 1.60 (*)    GFR calc non  Af Amer 34 (*)    GFR calc Af Amer 39 (*)    All other components within normal limits  LIPASE, BLOOD  CBC  URINALYSIS, ROUTINE W REFLEX MICROSCOPIC    EKG None  Radiology No results found.  Procedures Procedures (including critical care time)  Medications Ordered in ED Medications  sodium chloride 0.9 % bolus 500 mL (has no administration in time range)  potassium chloride SA (K-DUR) CR tablet 20 mEq (has no administration in time range)  sodium chloride flush (NS) 0.9 % injection 3 mL (3 mLs Intravenous Given 04/10/19 0538)  morphine 4 MG/ML injection 4 mg (4 mg Intravenous Given 04/10/19 0538)  ondansetron (ZOFRAN) injection 4 mg (4 mg Intravenous Given 04/10/19 0537)     Initial Impression / Assessment and Plan / ED Course  I have reviewed the triage vital signs and the nursing notes.  Pertinent labs & imaging results that were available during my care of the patient were reviewed by me and considered in my medical decision making (see chart for details).       Patient is a 63 year old woman with a past medical history of Crohn's disease who presents today for evaluation of abdominal pain.  She has been having intermittent abdominal pains over the past few weeks however had sudden onset of severe abdominal pain at about 11 PM with associated nausea, vomiting, and diarrhea.  She called her on-call GI from [place] who recommended that she come to the emergency room for scan.  Labs are obtained and reviewed, most recent labs for comparison were from July 2019.  Since then she has had a creatinine elevation from 0.87 up to 1.60 with decreased GFR from over 60 to 34.  Her alk phos is not elevated, she does not have significant  transaminitis.  She is slightly hypokalemic, given her decreased kidney function is given lower dose p.o. replacement.  Plan for CT scan to further evaluate her abdominal pain.   At shift change care was transferred to [facility] who will follow pending studies, re-evaulate and determine disposition.      Final Clinical Impressions(s) / ED Diagnoses   Final diagnoses:  Generalized abdominal pain    ED Discharge Orders    None       Ollen Gross 04/10/19 4720    Fatima Blank, MD 04/10/19 [phone]

## 2019-04-10 NOTE — Discharge Instructions (Signed)
Evaluated today for abdominal pain.  Your CT scan did show evidence of a mild Crohn's flare as well as a kidney stone.  I have consulted with Urology, Dr. Alinda Money.  They would like you to call their office today to schedule an appointment for today.  Given you a short course of pain medication as well as Zofran.  Please take as prescribed.  Please do not drive or operate heavy machinery while taking this medication.

## 2019-04-10 NOTE — ED Provider Notes (Signed)
Assumed at shift change from [facility].  See note for full HPI.   [place] 63 year old female with history of Crohn's, GERD who presents for evaluation of abdominal pain.  Has been having intermittent abdominal pain over the last few weeks.  Had colonoscopy by her GI physician, Dr. Oletta Lamas with [facility] on Monday, 4 days PTA.  Patient reports increased abdominal pain yesterday evening at 11 PM.  Pain located to right upper abdomen as well as bilateral lower quadrants.  Pain does radiate slightly into her flanks.  She has had 3 episodes of NBNB emesis as well as 5 episodes of small-volume nonbloody diarrhea.  Patient states she was doing well after her colonoscopy.  Denies any recent trauma or injury.  She has been on Flagyl.  Was given Zofran after her colonoscopy, however patient is not taking this as she did not she could take this with her Flagyl.  Denies fever, chills, chest pain, midline abdominal pain that radiates into her back, known history of stones, urinary symptoms, melena, hematochezia.  She has been tolerating p.o. intake at home without difficulty.    UA, CT scan pending at shift transfer.  Plan--if UA and CT scan negative and pain well controlled patient may follow-up outpatient with her GI physician.  If pain is not well controlled, abnormalities found on CT scan plan consult with [facility].  Labs with mild hypokalemia, given p.o. replacement, creatinine increased to 1.60, previous 0.87, GFR is also decreased at 39 previous at >60, question dehydration as cause of AKI? Lipase 47. CBCB without leukocytosis, Hgb 12.6  GI-- Dr. Alferd Apa  Physical Exam  BP 106/66   Pulse 74   Temp 98.1 F (36.7 C)   Resp 18   SpO2 93%   Physical Exam Vitals signs and nursing note reviewed.  Constitutional:      General: She is not in acute distress.    Appearance: She is well-developed.  HENT:     Head: Atraumatic.     Mouth/Throat:     Mouth: Mucous membranes are moist.  Eyes:      Pupils: Pupils are equal, round, and reactive to light.  Neck:     Musculoskeletal: Normal range of motion.  Cardiovascular:     Rate and Rhythm: Normal rate.     Heart sounds: Normal heart sounds.  Pulmonary:     Effort: No respiratory distress.     Comments: Clear to auscultation without wheeze, rhonchi or rales. Abdominal:     General: There is no distension.     Comments: Soft, nontender without rebound or guarding.  Normoactive bowel sounds.  Negative CVA tap bilaterally.  Musculoskeletal: Normal range of motion.  Skin:    General: Skin is warm and dry.  Neurological:     Mental Status: She is alert.    ED Course/Procedures    Ct Abdomen Pelvis Wo Contrast  Result Date: 03/19/2019 CLINICAL DATA:  Right lower quadrant abdominal pain. Microhematuria. History of Crohn's disease. History of a bowel resection. EXAM: CT ABDOMEN AND PELVIS WITHOUT CONTRAST TECHNIQUE: Multidetector CT imaging of the abdomen and pelvis was performed following the standard protocol without IV contrast. COMPARISON:  07/05/2009 FINDINGS: Lower chest: Clear lung bases.  Heart normal size. Hepatobiliary: No focal liver abnormality is seen. No gallstones, gallbladder wall thickening, or biliary dilatation. Pancreas: Unremarkable. No pancreatic ductal dilatation or surrounding inflammatory changes. Spleen: Normal in size without focal abnormality. Adrenals/Urinary Tract: No adrenal masses. Kidneys normal size, orientation and position. 1 mm  stone, lower pole of the left kidney. No other intrarenal stones. No renal masses. No hydronephrosis. Normal ureters. Normal bladder. Stomach/Bowel: There is wall thickening of the terminal ileum with hazy inflammation in the adjacent fat. This is similar to the appearance on the prior CT. The inflammation extends to the ileocolic junction. Surgical clips and staples along the posterior inferior margin of the remaining inferior right colon are stable from the prior exam. No colonic  wall thickening or inflammation. Remainder of the small bowel is normal in caliber with no other areas of wall thickening or inflammation. Normal stomach. Vascular/Lymphatic: Minor aortic atherosclerotic calcifications. No aneurysm. No other vascular abnormality. No enlarged lymph nodes. Reproductive: Uterus and bilateral adnexa are unremarkable. Other: No abdominal wall hernia or abnormality. No abdominopelvic ascites. Musculoskeletal: No fracture or acute finding. No osteoblastic or osteolytic lesions. IMPRESSION: 1. Wall thickening with adjacent inflammation of the distal ileum extending to the ileocolic anastomosis in the right mid abdomen. This is consistent with active Crohn's disease, similar in appearance to the prior CT, but with greater fat inflammation. No evidence of an abscess or fistula. No other evidence of active inflammatory bowel disease. 2. Mild aortic atherosclerosis. Electronically Signed   By: Lajean Manes M.D.   On: 03/19/2019 14:30   Ct Abdomen Pelvis W Contrast  Result Date: 04/10/2019 CLINICAL DATA:  Right-sided abdominal pain after colonoscopy. EXAM: CT ABDOMEN AND PELVIS WITH CONTRAST TECHNIQUE: Multidetector CT imaging of the abdomen and pelvis was performed using the standard protocol following bolus administration of intravenous contrast. CONTRAST:  26m ISOVUE-300 IOPAMIDOL (ISOVUE-300) INJECTION 61% COMPARISON:  CT scan of March 19, 2019. FINDINGS: Lower chest: No acute abnormality. Hepatobiliary: No focal liver abnormality is seen. No gallstones, gallbladder wall thickening, or biliary dilatation. Pancreas: Unremarkable. No pancreatic ductal dilatation or surrounding inflammatory changes. Spleen: Normal in size without focal abnormality. Adrenals/Urinary Tract: Adrenal glands appear normal. Left kidney and ureter are unremarkable. Moderate right hydroureteronephrosis is noted with perinephric stranding, secondary to 7 mm calculus in distal right ureter. Urinary bladder is  unremarkable. Stomach/Bowel: The stomach appears normal. There is no evidence of bowel obstruction. Status post appendectomy. Wall thickening inflammatory changes are seen involving the terminal ileum concerning for Crohn's disease. Vascular/Lymphatic: No significant vascular findings are present. No enlarged abdominal or pelvic lymph nodes. Reproductive: Uterus and bilateral adnexa are unremarkable. Other: No abdominal wall hernia or abnormality. No abdominopelvic ascites. Musculoskeletal: No acute or significant osseous findings. IMPRESSION: Moderate right hydroureteronephrosis is noted with perinephric stranding, secondary to 7 mm calculus in distal right ureter. Wall thickening of terminal ileum is noted with surrounding inflammation concerning for acute exacerbation of Crohn's disease. No abscess formation is noted. Electronically Signed   By: JMarijo ConceptionM.D.   On: 04/10/2019 07:54   Procedures Labs Reviewed  COMPREHENSIVE METABOLIC PANEL - Abnormal; Notable for the following components:      Result Value   Potassium 2.8 (*)    CO2 19 (*)    Glucose, Bld 111 (*)    Creatinine, Ser 1.60 (*)    GFR calc non Af Amer 34 (*)    GFR calc Af Amer 39 (*)    All other components within normal limits  URINALYSIS, ROUTINE W REFLEX MICROSCOPIC - Abnormal; Notable for the following components:   APPearance HAZY (*)    Specific Gravity, Urine 1.032 (*)    Hgb urine dipstick LARGE (*)    Leukocytes,Ua TRACE (*)    RBC / HPF >50 (*)  Bacteria, UA RARE (*)    All other components within normal limits  LIPASE, BLOOD  CBC   MDM  Patient care assumed at shift change. See note from [place], [place] note for full assessment.  In summation 63 year old female history of Crohn's colitis presents for evaluation of right-sided abdominal pain as well as bilateral lower quadrant tenderness.  Had colonoscopy for the symptoms on Monday.  Negative colonoscopy at the time.  Developed severe right flank as well as  right-sided abdominal pain yesterday p.m. No history of stones. No dysuria.  Multiple episodes of nonbilious emesis.  My evaluation abdomen soft, nontender without rebound or guarding.  She denies any current pain.  Denies any episodes of emesis since given pain medication and Zofran 3 hours ago.    She does have acute AKI with worsening of her GFR. Creatinine 1.6, previously 0.8, given IVF,GFR 34, previous greater than 60.  Lipase 47, CBC without leukocytosis, urinalysis with trace Leukocytes and rare bacteria, WBC 6-10. CT scan with moderate right-sided hydronephrosis with 7 mm stone in her distal right ureter.  Possible thickening of her terminal ileum suspicious for acute Crohn's flare?  Given AKI, worsening GFR, size of stone will conset with urology for evaluation.  Also plan on consulting with GI for her possible Crohn's flare.  Patient would like outpatient management if possible.   0900: Consulted with Urology, Dr. Alinda Money. Recommends outpatient follow-up.  Patient to call office today to schedule appointment for this afternoon to be seen today with possible lithotripsy today or Monday.  0920: Consulted with GI, Dr. Paulita Fujita. Recommends holding on steroids for possible Crohn's flare.  Feels pain most likely related to nephrolithiasis, which is consistent with my evaluation.   Patient tolerating p.o. intake in department without difficulty. Pain controlled in ED.  Patient is nontoxic, nonseptic appearing, in no apparent distress.  Patient's pain and other symptoms adequately managed in emergency department.  Fluid bolus given.  Labs, imaging and vitals reviewed.  Patient does not meet the SIRS or Sepsis criteria.  On repeat exam patient does not have a surgical abdomin and there are no peritoneal signs.  No indication of appendicitis, bowel obstruction, bowel perforation, cholecystitis, diverticulitis.  Patient discharged home with symptomatic treatment and given strict instructions for follow-up  with their primary care physician.  I have also discussed reasons to return immediately to the ER.  Patient expresses understanding and agrees with plan.   1. Nephrolithiasis 2. Chron's Disease 3. Elevated Creatinine     Henderly, Britni A, PA-C 04/10/19 [id number]    Hayden Rasmussen, MD 04/10/19 1755

## 2019-04-13 ENCOUNTER — Other Ambulatory Visit: Payer: Self-pay | Admitting: Urology

## 2019-04-14 ENCOUNTER — Encounter (HOSPITAL_COMMUNITY): Payer: Self-pay

## 2019-04-14 ENCOUNTER — Other Ambulatory Visit: Payer: Self-pay

## 2019-04-14 NOTE — Progress Notes (Signed)
SPOKE W/  [facility]     SCREENING SYMPTOMS OF COVID 19:   COUGH--NO  RUNNY NOSE--- NO  SORE THROAT---NO  NASAL CONGESTION----NO  SNEEZING----NO  SHORTNESS OF BREATH---NO  DIFFICULTY BREATHING---NO  TEMP >100.0 -----NO  UNEXPLAINED BODY ACHES------NO  CHILLS -------- NO  HEADACHES ---------NO  LOSS OF SMELL/ TASTE --------NO    HAVE YOU OR ANY FAMILY MEMBER TRAVELLED PAST 14 DAYS OUT OF THE   COUNTY---Lives in [place] STATE----NO COUNTRY----NO  HAVE YOU OR ANY FAMILY MEMBER BEEN EXPOSED TO ANYONE WITH COVID 19? NO

## 2019-04-16 ENCOUNTER — Ambulatory Visit (HOSPITAL_COMMUNITY)
Admission: RE | Admit: 2019-04-16 | Discharge: 2019-04-16 | Disposition: A | Discharge status: Home / Self Care | Payer: BC Managed Care – PPO | Attending: Urology | Admitting: Urology

## 2019-04-16 ENCOUNTER — Ambulatory Visit (HOSPITAL_COMMUNITY): Payer: BC Managed Care – PPO

## 2019-04-16 ENCOUNTER — Encounter (HOSPITAL_COMMUNITY)
Admission: RE | Disposition: A | Discharge status: Home / Self Care | Payer: Self-pay | Source: Home / Self Care | Attending: Urology

## 2019-04-16 ENCOUNTER — Encounter (HOSPITAL_COMMUNITY): Payer: Self-pay | Admitting: General Practice

## 2019-04-16 DIAGNOSIS — F419 Anxiety disorder, unspecified: Secondary | ICD-10-CM | POA: Insufficient documentation

## 2019-04-16 DIAGNOSIS — K5090 Crohn's disease, unspecified, without complications: Secondary | ICD-10-CM | POA: Insufficient documentation

## 2019-04-16 DIAGNOSIS — K219 Gastro-esophageal reflux disease without esophagitis: Secondary | ICD-10-CM | POA: Insufficient documentation

## 2019-04-16 DIAGNOSIS — N201 Calculus of ureter: Secondary | ICD-10-CM

## 2019-04-16 DIAGNOSIS — Z79899 Other long term (current) drug therapy: Secondary | ICD-10-CM | POA: Diagnosis not present

## 2019-04-16 DIAGNOSIS — N132 Hydronephrosis with renal and ureteral calculous obstruction: Secondary | ICD-10-CM | POA: Diagnosis not present

## 2019-04-16 HISTORY — PX: EXTRACORPOREAL SHOCK WAVE LITHOTRIPSY: SHX1557

## 2019-04-16 HISTORY — DX: Pure hypercholesterolemia, unspecified: E78.00

## 2019-04-16 HISTORY — DX: Meniere's disease, unspecified ear: H81.09

## 2019-04-16 HISTORY — DX: Unspecified hydronephrosis: N13.30

## 2019-04-16 HISTORY — DX: Atherosclerosis of aorta: I70.0

## 2019-04-16 HISTORY — DX: Other abnormalities of heart beat: R00.8

## 2019-04-16 HISTORY — DX: Personal history of urinary calculi: Z87.442

## 2019-04-16 HISTORY — DX: Unspecified osteoarthritis, unspecified site: M19.90

## 2019-04-16 SURGERY — LITHOTRIPSY, ESWL
Anesthesia: LOCAL | Laterality: Right

## 2019-04-16 MED ORDER — DIPHENHYDRAMINE HCL 25 MG PO CAPS
25.0000 mg | ORAL_CAPSULE | ORAL | Status: AC
  Administered 2019-04-16: 08:00:00 25 mg via ORAL
  Filled 2019-04-16: qty 1

## 2019-04-16 MED ORDER — DIAZEPAM 5 MG PO TABS
10.0000 mg | ORAL_TABLET | ORAL | Status: AC
  Administered 2019-04-16: 10 mg via ORAL
  Filled 2019-04-16: qty 2

## 2019-04-16 MED ORDER — CIPROFLOXACIN HCL 500 MG PO TABS
500.0000 mg | ORAL_TABLET | ORAL | Status: AC
  Administered 2019-04-16: 500 mg via ORAL
  Filled 2019-04-16: qty 1

## 2019-04-16 MED ORDER — SODIUM CHLORIDE 0.9 % IV SOLN
INTRAVENOUS | Status: DC
  Administered 2019-04-16: 08:00:00 via INTRAVENOUS

## 2019-04-16 NOTE — H&P (Signed)
Right ureteral calculus   Cheryl Ferguson is a pleasant 63 year old female who presented to the emergency department earlier this morning after having developed the acute onset of severe right lower quadrant pain with radiation to her right flank last night. This was associated with nausea and vomiting. She denies any fever. She states that she did develop some symptoms a few months ago including dysuria and frequency and was treated empirically for a urinary tract infection with ciprofloxacin. She also was told that she had microscopic hematuria. In addition, she did have some symptoms of right lower quadrant pain at that time. She had undergone a CT scan with contrast that appear to demonstrate evidence of acute inflammation related to her chronic Crohn's disease. No definite ureteral stone was identified at that time. No nonenhanced CT was performed. Due to her exacerbated symptoms last night, repeat CT imaging was performed this did confirm a 7 mm right distal ureteral stone. Her pain was able to be controlled. She follows up today for further evaluation.     ALLERGIES: Ambien Budesonide codeine Entocort EC CP24 infliximab keflex Nexium Remicade    MEDICATIONS: Crestor 5 mg tablet  B12 Shot Q Month  Claritin 10 mg capsule  Famotidine 20 mg tablet  Humira Pen  Metronidazole 250 mg tablet  Pentasa 500 mg capsule, extended release  Premarin 0.625 mg/gram cream with applicator  Singulair 10 mg tablet  Xanax 0.5 mg tablet  Zofran 8 mg tablet     GU PSH: None   NON-GU PSH: Appendectomy Foot surgery (unspecified), Left Remove Ovarian Cyst(s), Right Small bowel resection Tonsillectomy    GU PMH: None   NON-GU PMH: Acute gastric ulcer with hemorrhage Anemia, unspecified Anxiety Crohns Disease GERD Heart disease, unspecified    FAMILY HISTORY: 2 sons - Runs in Family Breast Cancer - Mother, Aunt, Cousin Gastric Cancer - Uncle, Sister   SOCIAL HISTORY: Marital Status:  Married Preferred Language: English; Ethnicity: Not Hispanic Or Latino; Race: White Current Smoking Status: Patient does not smoke anymore.   Tobacco Use Assessment Completed: Used Tobacco in last 30 days? Has never drank.  Does not drink caffeine.    REVIEW OF SYSTEMS:    GU Review Female:   Patient reports get up at night to urinate and leakage of urine. Patient denies frequent urination, hard to postpone urination, burning /pain with urination, stream starts and stops, trouble starting your stream, have to strain to urinate, and currently pregnant.  Gastrointestinal (Upper):   Patient reports nausea and vomiting.   Gastrointestinal (Lower):   Patient reports diarrhea.   Constitutional:   Patient reports fatigue. Patient denies fever, night sweats, and weight loss.  Skin:   Patient denies skin rash/ lesion and itching.  Eyes:   Patient reports blurred vision. Patient denies double vision.  Ears/ Nose/ Throat:   Patient reports sinus problems. Patient denies sore throat.  Hematologic/Lymphatic:   Patient reports easy bruising. Patient denies swollen glands.  Cardiovascular:   Patient denies leg swelling and chest pains.  Respiratory:   Patient reports cough. Patient denies shortness of breath.  Endocrine:   Patient denies excessive thirst.  Musculoskeletal:   Patient denies back pain and joint pain.  Neurological:   Patient denies headaches and dizziness.  Psychologic:   Patient denies depression and anxiety.   VITAL SIGNS:      04/10/2019 11:56 AM  Weight 142 lb / 64.41 kg  Height 63 in / 160.02 cm  BP 105/67 mmHg  Pulse 85 /min  Temperature  98.0 F / 36.6 C  BMI 25.2 kg/m   MULTI-SYSTEM PHYSICAL EXAMINATION:    Constitutional: Well-nourished. No physical deformities. Normally developed. Good grooming.  Neck: Neck symmetrical, not swollen. Normal tracheal position.  Respiratory: No labored breathing, no use of accessory muscles. Clear bilaterally.  Cardiovascular: Normal  temperature, normal extremity pulses, no swelling, no varicosities. Regular rate and rhythm.  Lymphatic: No enlargement of neck, axillae, groin.  Skin: No paleness, no jaundice, no cyanosis. No lesion, no ulcer, no rash.  Neurologic / Psychiatric: Oriented to time, oriented to place, oriented to person. No depression, no anxiety, no agitation.  Gastrointestinal: Mild right CVA tenderness.  Eyes: Normal conjunctivae. Normal eyelids.  Ears, Nose, Mouth, and Throat: Left ear no scars, no lesions, no masses. Right ear no scars, no lesions, no masses. Nose no scars, no lesions, no masses. Normal hearing. Normal lips.  Musculoskeletal: Normal gait and station of head and neck.     PAST DATA REVIEWED:  Source Of History:  Patient  Lab Test Review:   BMP  Records Review:   Previous Patient Records  Urine Test Review:   Urinalysis  X-Ray Review: KUB: Reviewed Films.  C.T. Abdomen/Pelvis: Reviewed Films.    Notes:                     CLINICAL DATA: Right-sided abdominal pain after colonoscopy.   EXAM:  CT ABDOMEN AND PELVIS WITH CONTRAST   TECHNIQUE:  Multidetector CT imaging of the abdomen and pelvis was performed  using the standard protocol following bolus administration of  intravenous contrast.   CONTRAST: 91m ISOVUE-300 IOPAMIDOL (ISOVUE-300) INJECTION 61%   COMPARISON: CT scan of March 19, 2019.   FINDINGS:  Lower chest: No acute abnormality.   Hepatobiliary: No focal liver abnormality is seen. No gallstones,  gallbladder wall thickening, or biliary dilatation.   Pancreas: Unremarkable. No pancreatic ductal dilatation or  surrounding inflammatory changes.   Spleen: Normal in size without focal abnormality.   Adrenals/Urinary Tract: Adrenal glands appear normal. Left kidney  and ureter are unremarkable. Moderate right hydroureteronephrosis is  noted with perinephric stranding, secondary to 7 mm calculus in  distal right ureter. Urinary bladder is unremarkable.    Stomach/Bowel: The stomach appears normal. There is no evidence of  bowel obstruction. Status post appendectomy. Wall thickening  inflammatory changes are seen involving the terminal ileum  concerning for Crohn's disease.   Vascular/Lymphatic: No significant vascular findings are present. No  enlarged abdominal or pelvic lymph nodes.   Reproductive: Uterus and bilateral adnexa are unremarkable.   Other: No abdominal wall hernia or abnormality. No abdominopelvic  ascites.   Musculoskeletal: No acute or significant osseous findings.   IMPRESSION:  Moderate right hydroureteronephrosis is noted with perinephric  stranding, secondary to 7 mm calculus in distal right ureter.   Wall thickening of terminal ileum is noted with surrounding  inflammation concerning for acute exacerbation of Crohn's disease.  No abscess formation is noted.    Electronically Signed  By: JMarijo ConceptionM.D.  On: 04/10/2019 07:54   I independently reviewed her KUB x-ray today. This demonstrates residual retained contrast in the renal collecting systems bilaterally. Contrast is seen down the right ureter where her stone is clearly identified and is visualized. This appears radiopaque a measures 7 mm consistent with her known stone on her CT scan. This has been marked for possible lithotripsy.   PROCEDURES:         KUB - [id number] A single view  of the abdomen is obtained.      Patient confirmed No Neulasta OnPro Device.            Urinalysis w/Scope Dipstick Dipstick Cont'd Micro  Color: Yellow Bilirubin: Neg mg/dL WBC/hpf: NS (Not Seen)  Appearance: Clear Ketones: Neg mg/dL RBC/hpf: 3 - 10/hpf  Specific Gravity: 1.015 Blood: 1+ ery/uL Bacteria: Rare (0-9/hpf)  pH: 6.0 Protein: Neg mg/dL Cystals: Ca Oxalate  Glucose: Neg mg/dL Urobilinogen: 0.2 mg/dL Casts: NS (Not Seen)    Nitrites: Neg Trichomonas: Not Present    Leukocyte Esterase: Neg leu/uL Mucous: Not Present      Epithelial Cells: 0 - 5/hpf       Yeast: NS (Not Seen)      Sperm: Not Present    ASSESSMENT:      ICD-10 Details  1 GU:   Ureteral calculus - N20.1    PLAN:            Medications New Meds: Hydrocodone-Acetaminophen 5 mg-325 mg tablet 1-2 tablet PO Q 6 H prn   #20  0 Refill(s)  Hydrocodone-Acetaminophen 5 mg-325 mg tablet 1-2 tablet PO Q 6 H prn   #20  0 Refill(s)  Tamsulosin Hcl 0.4 mg capsule 1 capsule PO Q HS   #14  0 Refill(s)  Tamsulosin Hcl 0.4 mg capsule 1 capsule PO Q HS   #14  0 Refill(s)  Zofran 4 mg tablet 1 tablet PO Q 8 H   #20  0 Refill(s)  Zofran 4 mg tablet 1 tablet PO Q 8 H   #20  0 Refill(s)            Orders X-Rays: KUB          Schedule Return Visit/Planned Activity: Other See Visit Notes             Note: Will call to schedule surgery          Document Letter(s):  Created for Patient: Clinical Summary         Notes:   1. Right distal ureteral calculus: I reviewed options with her today. She understands that she may pass her stone although the odds of spontaneous passage may be fairly low considering her stone size. As such, we reviewed options for definitive management with surgical intervention as well. After reviewing options, our plan is to proceed with an initial course of medical expulsion therapy. She has been provided a strainer and a prescription for tamsulosin 0.4 mg. She has been provided a prescription for hydrocodone for pain and Zofran for nausea. She will tentatively be scheduled for shockwave lithotripsy in 1 week if she is unable to pass her stone. We reviewed this procedure in detail including the potential risks, complications, and the expected recovery process. She understands the possibility that she could require a 2nd procedure such as ureteroscopy. We also discussed the option of primary ureteroscopic treatment and the pros and cons of this approach in comparison to shockwave lithotripsy. She knows to call should she develop fever, uncontrolled pain, or persistent  nausea/vomiting in the meantime.

## 2019-04-16 NOTE — Op Note (Signed)
See [facility] OP note scanned into chart. Also because of the size, density, location and other factors that cannot be anticipated I feel this will likely be a staged procedure. This fact supersedes any indication in the scanned [facility] operative note to the contrary.  

## 2019-04-16 NOTE — Discharge Instructions (Signed)
See [facility] discharge instructions in chart.  

## 2019-04-17 ENCOUNTER — Encounter (HOSPITAL_COMMUNITY): Payer: Self-pay | Admitting: Urology

## 2019-05-15 ENCOUNTER — Other Ambulatory Visit: Payer: Self-pay | Admitting: Acute Care

## 2019-05-15 DIAGNOSIS — Z87891 Personal history of nicotine dependence: Secondary | ICD-10-CM

## 2019-05-15 DIAGNOSIS — Z122 Encounter for screening for malignant neoplasm of respiratory organs: Secondary | ICD-10-CM

## 2019-07-01 ENCOUNTER — Telehealth: Payer: Self-pay | Admitting: *Deleted

## 2019-07-01 NOTE — Telephone Encounter (Signed)

## 2019-07-02 ENCOUNTER — Ambulatory Visit (INDEPENDENT_AMBULATORY_CARE_PROVIDER_SITE_OTHER)
Admission: RE | Admit: 2019-07-02 | Discharge: 2019-07-02 | Disposition: A | Discharge status: Home / Self Care | Payer: BC Managed Care – PPO | Source: Ambulatory Visit | Attending: Acute Care | Admitting: Acute Care

## 2019-07-02 ENCOUNTER — Other Ambulatory Visit: Payer: Self-pay

## 2019-07-02 DIAGNOSIS — Z87891 Personal history of nicotine dependence: Secondary | ICD-10-CM

## 2019-07-02 DIAGNOSIS — Z122 Encounter for screening for malignant neoplasm of respiratory organs: Secondary | ICD-10-CM

## 2019-07-13 ENCOUNTER — Telehealth: Payer: Self-pay | Admitting: Acute Care

## 2019-07-13 DIAGNOSIS — Z122 Encounter for screening for malignant neoplasm of respiratory organs: Secondary | ICD-10-CM

## 2019-07-13 DIAGNOSIS — Z87891 Personal history of nicotine dependence: Secondary | ICD-10-CM

## 2019-07-13 NOTE — Telephone Encounter (Signed)
Pt informed of CT results per Sarah Groce, NP.  PT verbalized understanding.  Copy sent to PCP.  Order placed for 1 yr f/u CT.  

## 2019-08-06 NOTE — Progress Notes (Signed)
Cardiology Office Note   Date:  08/07/2019   ID:  Cheryl Ferguson, Cheryl Ferguson 25-Feb-1956, MRN [id number]  PCP:  Gaynelle Arabian, MD    No chief complaint on file.  VT  Wt Readings from Last 3 Encounters:  08/07/19 149 lb 12.8 oz (67.9 kg)  04/16/19 144 lb 6 oz (65.5 kg)  06/15/18 144 lb (65.3 kg)       History of Present Illness: Cheryl Ferguson is a 63 y.o. female  who had VT in 2009.  SHe has a normal LVEF. SHe has Crohns also.  Nexium was stopped and potassium and Mg normalized.  At that point, her palpitations (VT) resolved.  Ventricular tachycardia: This has not been an issue since her electrolytes have been stable. Her electrolyte issue has been related to her Crohn's disease.  I have not seen her since 2016.  Encouraged her to increase exercise at that time.   She stopped her verapamil in 2018.    A few months ago, she had a kidney stone and Crohns flareup.  She was stressed at that time. She had lithotripsy.  She felt sx of trigeminy.  Potassium was low from diarrhea at that time.  It lasted several days. After her Crohn's resolved, sx resolved.    Denies : Chest pain. Dizziness. Leg edema. Nitroglycerin use. Orthopnea. Palpitations. Paroxysmal nocturnal dyspnea. Shortness of breath. Syncope.   She had been going to the gym prior to [place].  She is doing home improvement projects at home.  No problems with that work.   Right calf fullness sensation.  It has been present for several week.  No recent travel or immobility.      Past Medical History:  Diagnosis Date  . Anemia   . Anxiety   . Aortic atherosclerosis ([place]) 03/19/2019   Noted in CT abd  . Arthritis   . Crohn disease ([place])   . GERD (gastroesophageal reflux disease)   . High cholesterol   . History of kidney stones 04/10/2019   2m calculus in right ureter, noted on CT abd  . Hydroureteronephrosis    Moderate, right,noted on CT abd  . Meniere disease   . Phlebitis    Bilateral lower legs  .  Seasonal allergies   . Trigeminy   . Ulcer of abdomen wall ([facility])    stomach ulcers  . Ventricular tachycardia ([facility] 2009    Past Surgical History:  Procedure Laterality Date  . APPENDECTOMY    . BOWEL RESECTION    . CESAREAN SECTION    . COLONOSCOPY  03/2019  . EXTRACORPOREAL SHOCK WAVE LITHOTRIPSY Right 04/16/2019   Procedure: EXTRACORPOREAL SHOCK WAVE LITHOTRIPSY (ESWL);  Surgeon: HArdis Hughs MD;  Location: [facility];  Service: Urology;  Laterality: Right;  . FOOT SURGERY Left   . OVARIAN CYST REMOVAL    . TONSILLECTOMY    . UPPER GI ENDOSCOPY       Current Outpatient Medications  Medication Sig Dispense Refill  . ALPRAZolam (XANAX) 0.5 MG tablet every 8 (eight) hours as needed.     . B-12, Methylcobalamin, 1000 MCG SUBL Inject as directed every 30 (thirty) days. Pt unsure of dosage given.    . budesonide (ENTOCORT EC) 3 MG 24 hr capsule Take 6 mg by mouth daily.    .Marland Kitchenconjugated estrogens (PREMARIN) vaginal cream Place 1 Applicatorful vaginally 2 (two) times a week.    . famotidine (PEPCID) 20 MG tablet Take 20 mg by mouth 4 (four) times daily.    .Marland Kitchen  HUMIRA PEN 40 MG/0.8ML PNKT every 14 (fourteen) days.     Marland Kitchen loratadine (CLARITIN) 10 MG tablet Take 10 mg by mouth every evening.    . montelukast (SINGULAIR) 10 MG tablet every morning.     . ondansetron (ZOFRAN) 8 MG tablet PLEASE SEE ATTACHED FOR DETAILED DIRECTIONS    . PENTASA 500 MG CR capsule 4 (four) times daily.     . potassium chloride SA (K-DUR) 20 MEQ tablet Take 20 mEq by mouth daily.    . rosuvastatin (CRESTOR) 5 MG tablet Take 5 mg by mouth daily. 3 pm     No current facility-administered medications for this visit.     Allergies:   Remicade [infliximab], Entocort ec [budesonide], Codeine, Esomeprazole magnesium, Zolpidem tartrate, and Cephalexin    Social History:  The patient  reports that she quit smoking about 10 years ago. Her smoking use included cigarettes. She has a 60.00 pack-year smoking history.  She has never used smokeless tobacco. She reports current alcohol use. She reports that she does not use drugs.   Family History:  The patient's family history includes Hypertension in her father.    ROS:  Please see the history of present illness.   Otherwise, review of systems are positive for right calf enlargement.   All other systems are reviewed and negative.    PHYSICAL EXAM: VS:  BP 118/74   Pulse 77   Ht 5' 3"  (1.6 m)   Wt 149 lb 12.8 oz (67.9 kg)   SpO2 97%   BMI 26.54 kg/m  , BMI Body mass index is 26.54 kg/m. GEN: Well nourished, well developed, in no acute distress  HEENT: normal  Neck: no JVD, carotid bruits, or masses Cardiac: RRR; no murmurs, rubs, or gallops,no edema  Respiratory:  clear to auscultation bilaterally, normal work of breathing GI: soft, nontender, nondistended, + BS MS: no deformity or atrophy ; right calf slightly larger than left without edema Skin: warm and dry, no rash Neuro:  Strength and sensation are intact Psych: euthymic mood, full affect   EKG:   The ekg ordered today demonstrates NSR, no ST changes   Recent Labs: 04/10/2019: ALT 21; BUN 11; Creatinine, Ser 1.60; Hemoglobin 12.6; Platelets 238; Potassium 2.8; Sodium 144   Lipid Panel    Component Value Date/Time   CHOL (H) 01/22/2009 0555    203        ATP III CLASSIFICATION:  <200     mg/dL   Desirable  200-239  mg/dL   Borderline High  >=240    mg/dL   High          TRIG 92 01/22/2009 0555   HDL 86 01/22/2009 0555   CHOLHDL 2.4 01/22/2009 0555   VLDL 18 01/22/2009 0555   LDLCALC  01/22/2009 0555    99        Total Cholesterol/HDL:CHD Risk Coronary Heart Disease Risk Table                     Men   Women  1/2 Average Risk   3.4   3.3  Average Risk       5.0   4.4  2 X Average Risk   9.6   7.1  3 X Average Risk  23.4   11.0        Use the calculated Patient Ratio above and the CHD Risk Table to determine the patient's CHD Risk.        ATP III  CLASSIFICATION (LDL):   <100     mg/dL   Optimal  100-129  mg/dL   Near or Above                    Optimal  130-159  mg/dL   Borderline  160-189  mg/dL   High  >190     mg/dL   Very High     Other studies Reviewed: Additional studies/ records that were reviewed today with results demonstrating: .   ASSESSMENT AND PLAN:  1. Ventricular tachycardia: Noted in the past during a Crohn's flare. Had PVCs when potassium was low.  Will give her verapamil to use as needed, at prior dose that she was taking until 2018.  2. High good cholesterol.   COntinue trying to get more exercise as she was doing before COVID.  3. Swollen right calf.  No pitting edema, but she feels there is something in the back of the leg.  Ceck right leg venous Duplex to r/o DVT.    Current medicines are reviewed at length with the patient today.  The patient concerns regarding her medicines were addressed.  The following changes have been made:  No change  Labs/ tests ordered today include:  No orders of the defined types were placed in this encounter.   Recommend 150 minutes/week of aerobic exercise Low fat, low carb, high fiber diet recommended  Disposition:   FU in as needed   Signed, Larae Grooms, MD  08/07/2019 2:23 PM    [facility] [address] [place], [place]  [address] Phone: [phone]; Fax: [phone]

## 2019-08-07 ENCOUNTER — Ambulatory Visit (INDEPENDENT_AMBULATORY_CARE_PROVIDER_SITE_OTHER): Payer: BC Managed Care – PPO | Admitting: Interventional Cardiology

## 2019-08-07 ENCOUNTER — Encounter: Payer: Self-pay | Admitting: Interventional Cardiology

## 2019-08-07 ENCOUNTER — Other Ambulatory Visit: Payer: Self-pay

## 2019-08-07 VITALS — BP 118/74 | HR 77 | Ht 63.0 in | Wt 149.8 lb

## 2019-08-07 DIAGNOSIS — I493 Ventricular premature depolarization: Secondary | ICD-10-CM | POA: Diagnosis not present

## 2019-08-07 DIAGNOSIS — I472 Ventricular tachycardia: Secondary | ICD-10-CM | POA: Diagnosis not present

## 2019-08-07 DIAGNOSIS — M7989 Other specified soft tissue disorders: Secondary | ICD-10-CM

## 2019-08-07 DIAGNOSIS — I4729 Other ventricular tachycardia: Secondary | ICD-10-CM

## 2019-08-07 MED ORDER — VERAPAMIL HCL ER 240 MG PO TBCR: EXTENDED_RELEASE_TABLET | ORAL | 0 refills | Status: DC

## 2019-08-07 NOTE — Patient Instructions (Signed)
Medication Instructions:  Your physician has recommended you make the following change in your medication:  START VERAPAMIL CR 240 MG DAILY AS NEEDED FOR PALPITATIONS.   If you need a refill on your cardiac medications before your next appointment, please call your pharmacy.   Lab work: NONE  If you have labs (blood work) drawn today and your tests are completely normal, you will receive your results only by: Marland Kitchen MyChart Message (if you have MyChart) OR . A paper copy in the mail If you have any lab test that is abnormal or we need to change your treatment, we will call you to review the results.  Testing/Procedures: Your physician has requested that you have a lower or upper extremity venous duplex. ON Monday This test is an ultrasound of the veins in the legs or arms. It looks at venous blood flow that carries blood from the heart to the legs or arms. Allow one hour for a Lower Venous exam. Allow thirty minutes for an Upper Venous exam. There are no restrictions or special instructions.    Follow-Up: AS NEEDED   Any Other Special Instructions Will Be Listed Below (If Applicable).  Verapamil sustained-release capsules What is this medicine? VERAPAMIL (ver AP a mil) is a calcium-channel blocker. It affects the amount of calcium found in your heart and muscle cells. This relaxes your blood vessels, which can reduce the amount of work the heart has to do. This medicine is used to lower high blood pressure. This medicine may be used for other purposes; ask your health care provider or pharmacist if you have questions. COMMON BRAND NAME(S): Verelan, Verelan PM What should I tell my health care provider before I take this medicine? They need to know if you have any of these conditions:  heart or blood vessel disease  heart rhythm disturbances such as sick sinus syndrome, ventricular arrhythmias, Wolff-Parkinson-White syndrome, or Lown-Ganong-Levine syndrome  liver or kidney  disease  low blood pressure  an unusual or allergic reaction to verapamil, other medicines, foods, dyes, or preservatives  pregnant or trying to get pregnant  breast-feeding How should I use this medicine? Take this medicine by mouth with a glass of water. Follow the directions on the prescription label. The capsules may be opened and the medicine poured into a small amount of applesauce. Stir well and swallow without chewing. Take this medicine with food to reduce stomach upset. Take your doses at regular intervals. Do not take your medicine more often then directed. Do not stop taking except on the advice of your doctor or health care professional. Talk to your pediatrician regarding the use of this medicine in children. Special care may be needed. Overdosage: If you think you have taken too much of this medicine contact a poison control center or emergency room at once. NOTE: This medicine is only for you. Do not share this medicine with others. What if I miss a dose? If you miss a dose, take it as soon as you can. If it is almost time for your next dose, take only that dose. Do not take double or extra doses. What may interact with this medicine? Do not take this medicine with any of the following:  cisapride  disopyramide  dofetilide  grapefruit juice  hawthorn  pimozide  red yeast rice This medicine may also interact with the following medications:  barbiturates such as phenobarbital  cimetidine  cyclosporine  lithium  local anesthetics or general anesthetics  medicines for heart rhythm problems like  amiodarone, digoxin, flecainide, procainamide, quinidine  medicines for high blood pressure or heart problems  medicines for seizures like carbamazepine and phenytoin  rifampin, rifabutin or rifapentine  theophylline or aminophylline This list may not describe all possible interactions. Give your health care provider a list of all the medicines, herbs,  non-prescription drugs, or dietary supplements you use. Also tell them if you smoke, drink alcohol, or use illegal drugs. Some items may interact with your medicine. What should I watch for while using this medicine? Check your blood pressure and pulse rate regularly. Ask your doctor or health care professional what your blood pressure and pulse rate should be and when you should contact him or her. Do not suddenly stop taking this medicine. Ask your doctor or health care professional how to gradually reduce the dose. You may get drowsy or dizzy. Do not drive, use machinery, or do anything that needs mental alertness until you know how this medicine affects you. Do not stand or sit up quickly, especially if you are an older patient. This reduces the risk of dizzy or fainting spells. Alcohol may interfere with the effect of this medicine. Avoid alcoholic drinks. What side effects may I notice from receiving this medicine? Side effects that you should report to your doctor or health care professional as soon as possible:  difficulty breathing  dizziness or light headedness  fainting  fast heartbeat, palpitations, irregular heartbeat, or chest pain  skin rash  slow heartbeat  swelling of the legs or ankles Side effects that usually do not require medical attention (report to your doctor or health care professional if they continue or are bothersome):  constipation  facial flushing  headache  nausea, vomiting  sexual dysfunction  weakness or tiredness This list may not describe all possible side effects. Call your doctor for medical advice about side effects. You may report side effects to FDA at 1-800-FDA-1088. Where should I keep my medicine? Keep out of the reach of children. Store at room temperature between 15 and 25 degrees C (59 and 77 degrees F). Protect from light and moisture. Keep container tightly closed. NOTE: This sheet is a summary. It may not cover all possible  information. If you have questions about this medicine, talk to your doctor, pharmacist, or health care provider.  2020 Elsevier/Gold Standard (2008-08-23 17:47:58)

## 2019-08-10 ENCOUNTER — Other Ambulatory Visit: Payer: Self-pay

## 2019-08-10 ENCOUNTER — Ambulatory Visit (HOSPITAL_COMMUNITY)
Admission: RE | Admit: 2019-08-10 | Discharge: 2019-08-10 | Disposition: A | Discharge status: Home / Self Care | Payer: BC Managed Care – PPO | Source: Ambulatory Visit | Attending: Cardiovascular Disease | Admitting: Cardiovascular Disease

## 2019-08-10 DIAGNOSIS — M7989 Other specified soft tissue disorders: Secondary | ICD-10-CM | POA: Diagnosis not present

## 2019-09-04 ENCOUNTER — Other Ambulatory Visit: Payer: Self-pay | Admitting: Interventional Cardiology

## 2020-03-07 ENCOUNTER — Other Ambulatory Visit: Payer: Self-pay | Admitting: Family Medicine

## 2020-03-07 DIAGNOSIS — R202 Paresthesia of skin: Secondary | ICD-10-CM

## 2020-03-31 ENCOUNTER — Ambulatory Visit
Admission: RE | Admit: 2020-03-31 | Discharge: 2020-03-31 | Disposition: A | Discharge status: Home / Self Care | Payer: BC Managed Care – PPO | Source: Ambulatory Visit | Attending: Family Medicine | Admitting: Family Medicine

## 2020-03-31 ENCOUNTER — Other Ambulatory Visit: Payer: Self-pay

## 2020-03-31 DIAGNOSIS — R202 Paresthesia of skin: Secondary | ICD-10-CM

## 2020-03-31 MED ORDER — GADOBENATE DIMEGLUMINE 529 MG/ML IV SOLN
14.0000 mL | Freq: Once | INTRAVENOUS | Status: AC | PRN
  Administered 2020-03-31: 14 mL via INTRAVENOUS

## 2020-04-04 ENCOUNTER — Encounter: Payer: Self-pay | Admitting: Neurology

## 2020-04-15 ENCOUNTER — Other Ambulatory Visit: Payer: Self-pay

## 2020-04-15 ENCOUNTER — Ambulatory Visit (INDEPENDENT_AMBULATORY_CARE_PROVIDER_SITE_OTHER): Payer: BC Managed Care – PPO

## 2020-04-15 ENCOUNTER — Ambulatory Visit: Payer: BC Managed Care – PPO | Admitting: Podiatry

## 2020-04-15 DIAGNOSIS — M722 Plantar fascial fibromatosis: Secondary | ICD-10-CM | POA: Diagnosis not present

## 2020-04-15 MED ORDER — METHYLPREDNISOLONE 4 MG PO TBPK: ORAL_TABLET | ORAL | 0 refills | Status: DC

## 2020-04-20 NOTE — Progress Notes (Signed)
   Subjective: 64 y.o. female presenting today with a chief complaint of sharp right heel pain that began about 6 weeks ago. She states the pain is gradually worsening and is worse first thing in the morning. Walking increases the pain. She has been taking Tylenol for treatment. Patient is here for further evaluation and treatment.   Past Medical History:  Diagnosis Date  . Anemia   . Anxiety   . Aortic atherosclerosis ([place]) 03/19/2019   Noted in CT abd  . Arthritis   . Crohn disease ([place])   . GERD (gastroesophageal reflux disease)   . High cholesterol   . History of kidney stones 04/10/2019   80m calculus in right ureter, noted on CT abd  . Hydroureteronephrosis    Moderate, right,noted on CT abd  . Meniere disease   . Phlebitis    Bilateral lower legs  . Seasonal allergies   . Trigeminy   . Ulcer of abdomen wall (HCC)    stomach ulcers  . Ventricular tachycardia ([place] 2009     Objective: Physical Exam General: The patient is alert and oriented x3 in no acute distress.  Dermatology: Skin is warm, dry and supple bilateral lower extremities. Negative for open lesions or macerations bilateral.   Vascular: Dorsalis Pedis and Posterior Tibial pulses palpable bilateral.  Capillary fill time is immediate to all digits.  Neurological: Epicritic and protective threshold intact bilateral.   Musculoskeletal: Tenderness to palpation to the plantar aspect of the right heel along the plantar fascia. All other joints range of motion within normal limits bilateral. Strength 5/5 in all groups bilateral.   Radiographic exam: Normal osseous mineralization. Joint spaces preserved. No fracture/dislocation/boney destruction. No other soft tissue abnormalities or radiopaque foreign bodies.   Assessment: 1. Plantar fasciitis right  Plan of Care:  1. Patient evaluated. Xrays reviewed.   2. Injection of 0.5cc Celestone soluspan injected into the right plantar fascia  3. Rx for Medrol Dose  Pack placed 4. Patient on Humira for Crohn's disease. Cannot take NSAIDs.  5. Return to clinic in 4 weeks.    BEdrick Kins DPM [facility]  Dr. BEdrick Kins DPM    [address] [address]               Office [phone] Fax [phone]

## 2020-05-04 IMAGING — CT CT ABDOMEN AND PELVIS WITH CONTRAST
2 of 5 series · 16 of 46 positions shown, 18 images · IV contrast (APPLIED)
Comparison: CT scan of March 19, 2019.

CLINICAL DATA: Right-sided abdominal pain after colonoscopy.

EXAM:
CT ABDOMEN AND PELVIS WITH CONTRAST
TECHNIQUE: Multidetector CT imaging of the abdomen and pelvis was performed
using the standard protocol following bolus administration of
intravenous contrast.
CONTRAST:  80mL 4S9PAT-TBB IOPAMIDOL (4S9PAT-TBB) INJECTION 61%

[Series 3: abdomen 5.0 · axial · 0.73mm/px · z∈[+794,+1169]mm · 13 of 87 slices shown, 15 images]
[im 6/87  soft-tissue]
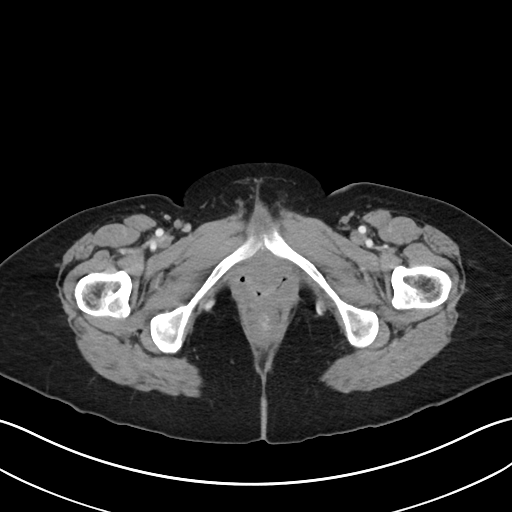
[im 6/87  bone]
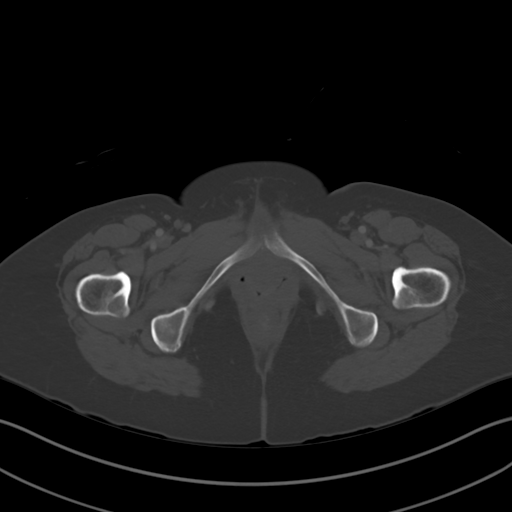
[im 11/87  soft-tissue]
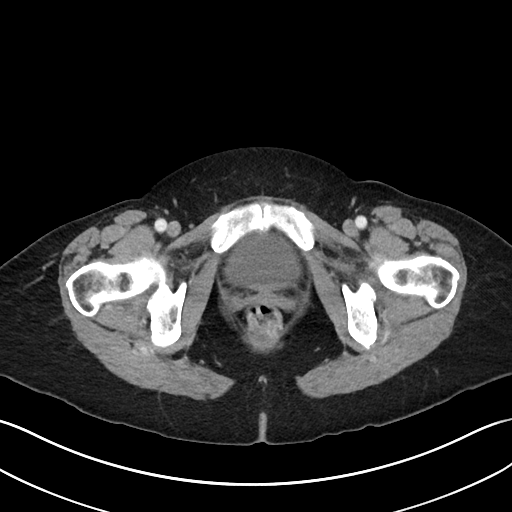
[im 17/87  soft-tissue]
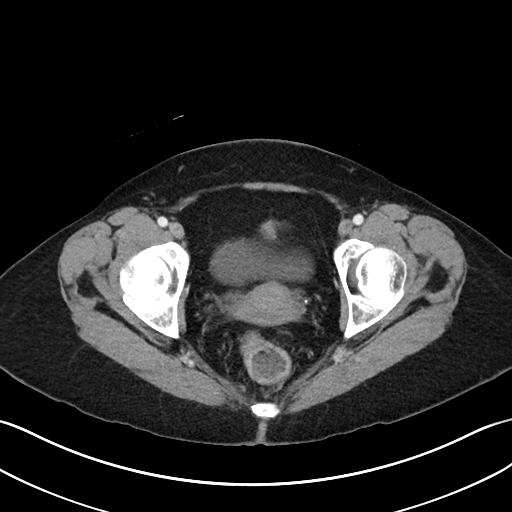
[im 27/87  soft-tissue]
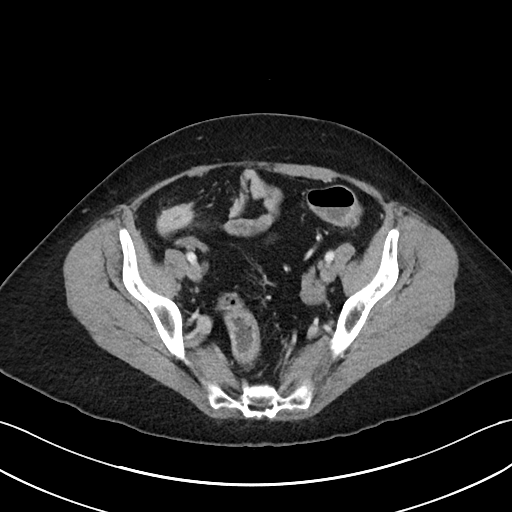
[im 33/87  soft-tissue]
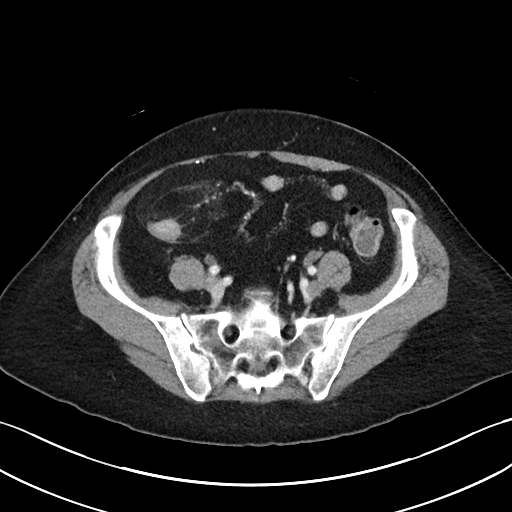
[im 38/87  soft-tissue]
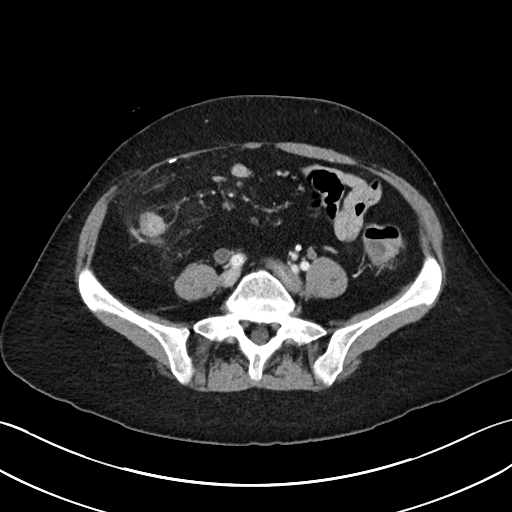
[im 44/87  soft-tissue]
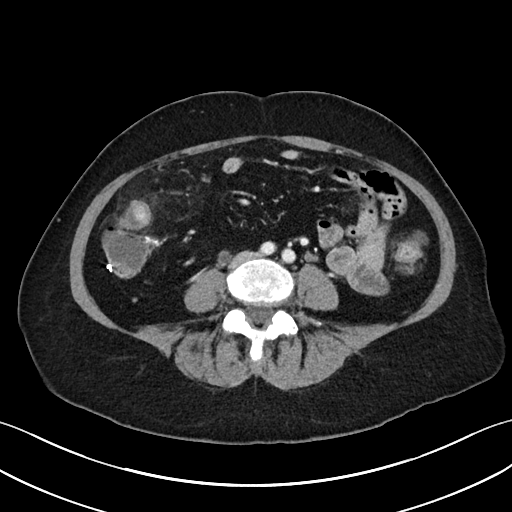
[im 49/87  soft-tissue]
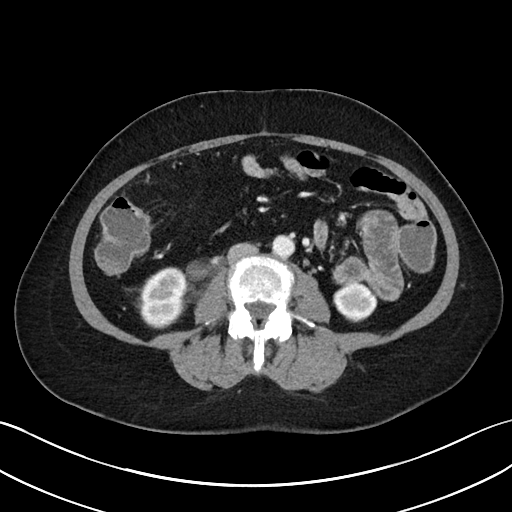
[im 54/87  soft-tissue]
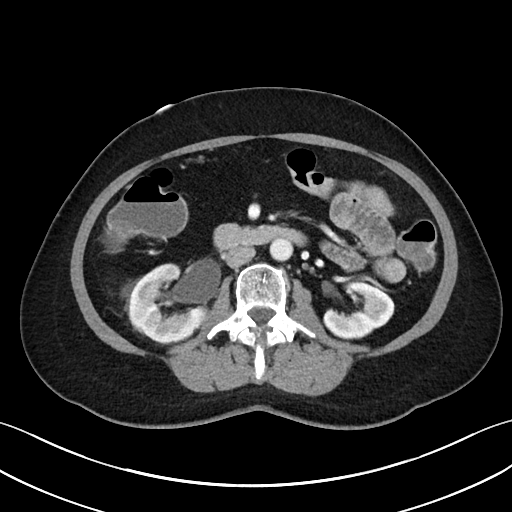
[im 54/87  bone]
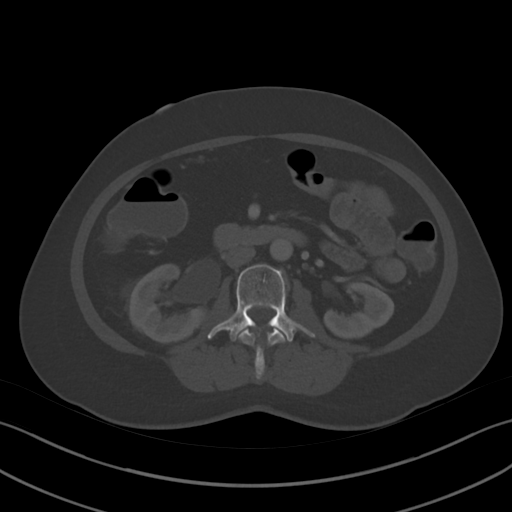
[im 60/87  soft-tissue]
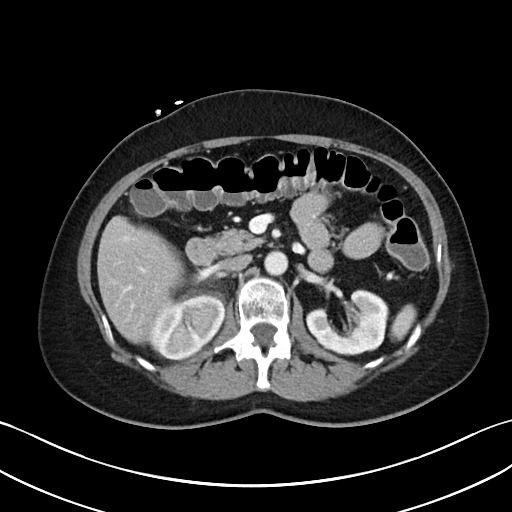
[im 70/87  soft-tissue]
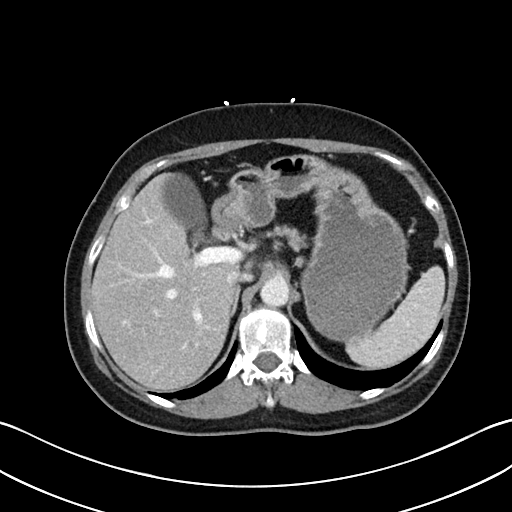
[im 76/87  soft-tissue]
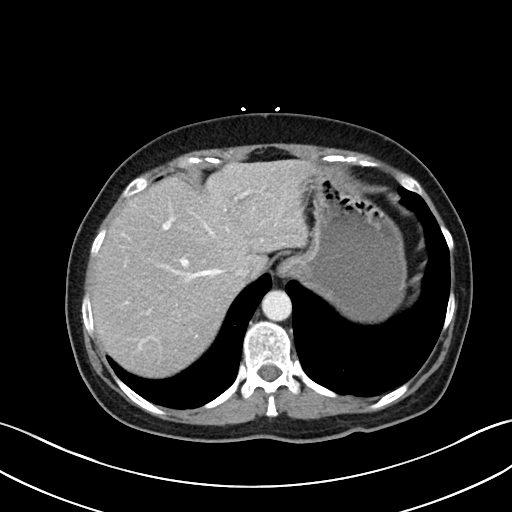
[im 81/87  soft-tissue]
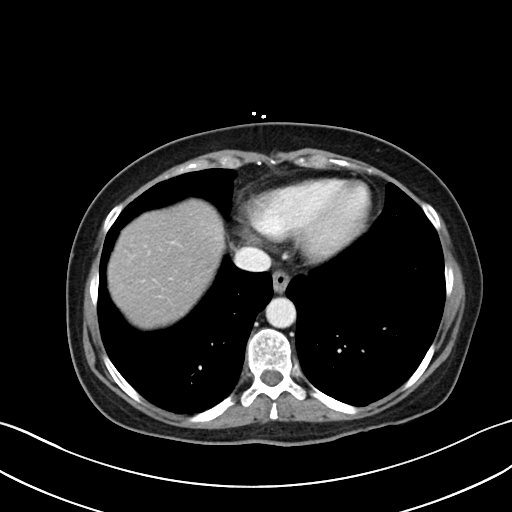

[Series 6: abdomen 3.0 mpr cor · coronal · 0.97mm/px · 3 of 101 slices shown]
[im 34/101  soft-tissue]
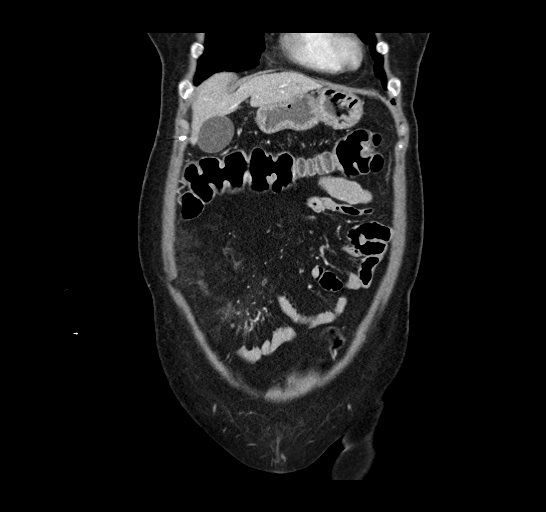
[im 45/101  soft-tissue]
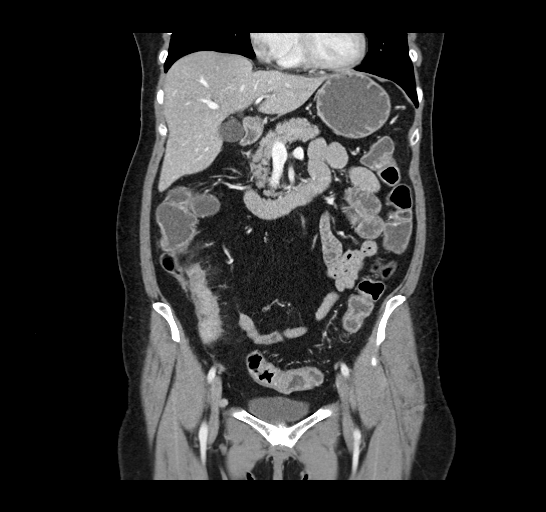
[im 56/101  soft-tissue]
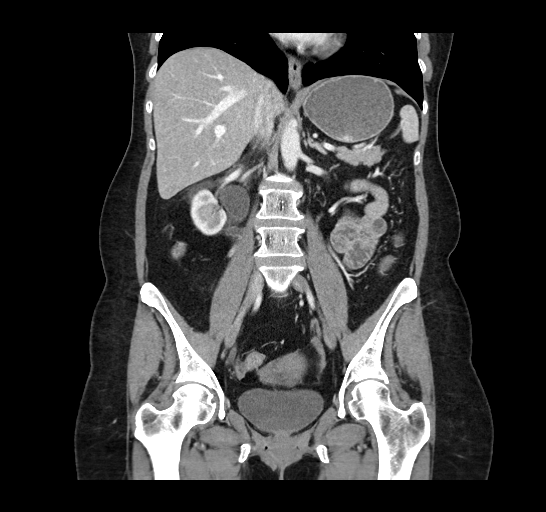

[16 of 46 positions shown; findings below may reference images not displayed]

FINDINGS: Lower chest: No acute abnormality.

Hepatobiliary: No focal liver abnormality is seen. No gallstones,
gallbladder wall thickening, or biliary dilatation.

Pancreas: Unremarkable. No pancreatic ductal dilatation or
surrounding inflammatory changes.

Spleen: Normal in size without focal abnormality.

Adrenals/Urinary Tract: Adrenal glands appear normal. Left kidney
and ureter are unremarkable. Moderate right hydroureteronephrosis is
noted with perinephric stranding, secondary to 7 mm calculus in
distal right ureter. Urinary bladder is unremarkable.

Stomach/Bowel: The stomach appears normal. There is no evidence of
bowel obstruction. Status post appendectomy. Wall thickening
inflammatory changes are seen involving the terminal ileum
concerning for Crohn's disease.

Vascular/Lymphatic: No significant vascular findings are present. No
enlarged abdominal or pelvic lymph nodes.

Reproductive: Uterus and bilateral adnexa are unremarkable.

Other: No abdominal wall hernia or abnormality. No abdominopelvic
ascites.

Musculoskeletal: No acute or significant osseous findings.
IMPRESSION: Moderate right hydroureteronephrosis is noted with perinephric
stranding, secondary to 7 mm calculus in distal right ureter.

Wall thickening of terminal ileum is noted with surrounding
inflammation concerning for acute exacerbation of Crohn's disease.
No abscess formation is noted.

## 2020-05-13 ENCOUNTER — Ambulatory Visit: Payer: BC Managed Care – PPO | Admitting: Podiatry

## 2020-05-20 ENCOUNTER — Other Ambulatory Visit: Payer: Self-pay | Admitting: Family Medicine

## 2020-05-20 DIAGNOSIS — R634 Abnormal weight loss: Secondary | ICD-10-CM

## 2020-05-20 DIAGNOSIS — R109 Unspecified abdominal pain: Secondary | ICD-10-CM

## 2020-06-06 ENCOUNTER — Ambulatory Visit
Admission: RE | Admit: 2020-06-06 | Discharge: 2020-06-06 | Disposition: A | Discharge status: Home / Self Care | Payer: BC Managed Care – PPO | Source: Ambulatory Visit | Attending: Family Medicine | Admitting: Family Medicine

## 2020-06-06 DIAGNOSIS — R109 Unspecified abdominal pain: Secondary | ICD-10-CM

## 2020-06-06 DIAGNOSIS — R634 Abnormal weight loss: Secondary | ICD-10-CM

## 2020-06-06 MED ORDER — IOPAMIDOL (ISOVUE-300) INJECTION 61%
100.0000 mL | Freq: Once | INTRAVENOUS | Status: AC | PRN
  Administered 2020-06-06: 100 mL via INTRAVENOUS

## 2020-06-16 NOTE — Progress Notes (Addendum)
NEUROLOGY CONSULTATION NOTE  Cheryl Ferguson MRN: [id number] DOB: 11/12/1956  Referring provider: Gaynelle Arabian, MD Primary care provider: Gaynelle Arabian, MD  Reason for consult:  Numbness and tingling and weakness  HISTORY OF PRESENT ILLNESS: Cheryl Ferguson is a 64 year old left-handed female with Crohn disease, Meniere disease, and arthritis who presents for numbness, paresthesias and weakness.  History supplemented by referring provider's note.  In January, she started having problems with legs and left arm.  They felt weak.  If standing, she felt like she was going to fall.  Legs don't buckle, rather they feel weak.  Difficulty walking for prolonged periods and doing landscaping in her yard.  If it occurs, she will sit for 10 to 15 minutes and can resume her activity.  Difficulty opening jars or lifting arms to wash her hair.  At first she thought there was associated dizziness but now thinks that was just anxiety.  She sometimes experiences spots of numbness and tingling in her legs.  At night, left leg feels achy and has to stretch it.  Sometimes cramp in right calf.  Otherwise, no myalgias.  She has chronic neck and back pain but no radicular pain.  She tries to exercise at home by doing squats or walks.  Prior to the pandemic, she was going to gym 3 to 4 times a week until it closed.  At first, her PCP thought it may be related to her statin, but symptoms persisted despite discontinuation.  MRI of brain with and without contrast on 03/31/2020 personally reviewed and demonstrated few nonspecific non-enhancing T2 hyperintense foci in the bilateral frontal white matter, likely chronic small vessel ischemic changes, but otherwise unremarkable.  She has Crohn's disease and for years takes monthly B12 shots and KCl supplementation.  Labs from March include CMP with Na 140, K 3.5, Cl 108, Ca 9.4, glucose 85, BUN 15, Cr 0.98, t bili 0.7, ALP 80, AST 31, ALT 25  PAST MEDICAL  HISTORY: Past Medical History:  Diagnosis Date  . Anemia   . Anxiety   . Aortic atherosclerosis ([place]) 03/19/2019   Noted in CT abd  . Arthritis   . Crohn disease ([place])   . GERD (gastroesophageal reflux disease)   . High cholesterol   . History of kidney stones 04/10/2019   78m calculus in right ureter, noted on CT abd  . Hydroureteronephrosis    Moderate, right,noted on CT abd  . Meniere disease   . Phlebitis    Bilateral lower legs  . Seasonal allergies   . Trigeminy   . Ulcer of abdomen wall (H[facility])    stomach ulcers  . Ventricular tachycardia ([place] 2009    PAST SURGICAL HISTORY: Past Surgical History:  Procedure Laterality Date  . APPENDECTOMY    . BOWEL RESECTION    . CESAREAN SECTION    . COLONOSCOPY  03/2019  . EXTRACORPOREAL SHOCK WAVE LITHOTRIPSY Right 04/16/2019   Procedure: EXTRACORPOREAL SHOCK WAVE LITHOTRIPSY (ESWL);  Surgeon: HArdis Hughs MD;  Location: [facility];  Service: Urology;  Laterality: Right;  . FOOT SURGERY Left   . OVARIAN CYST REMOVAL    . TONSILLECTOMY    . UPPER GI ENDOSCOPY      MEDICATIONS: Current Outpatient Medications on File Prior to Visit  Medication Sig Dispense Refill  . ALPRAZolam (XANAX) 0.5 MG tablet every 8 (eight) hours as needed.     . B-12, Methylcobalamin, 1000 MCG SUBL Inject as directed every 30 (thirty) days. Pt unsure  of dosage given.    . conjugated estrogens (PREMARIN) vaginal cream Place 1 Applicatorful vaginally 2 (two) times a week.    . famotidine (PEPCID) 20 MG tablet Take 20 mg by mouth 4 (four) times daily.    Marland Kitchen HUMIRA PEN 40 MG/0.8ML PNKT every 14 (fourteen) days.     . methylPREDNISolone (MEDROL DOSEPAK) 4 MG TBPK tablet 6 day dose pack - take as directed 21 tablet 0  . ondansetron (ZOFRAN) 8 MG tablet PLEASE SEE ATTACHED FOR DETAILED DIRECTIONS    . PENTASA 500 MG CR capsule 4 (four) times daily.     . potassium chloride SA (K-DUR) 20 MEQ tablet Take 20 mEq by mouth daily.    . rosuvastatin (CRESTOR) 5  MG tablet Take 5 mg by mouth daily. 3 pm    . verapamil (CALAN-SR) 240 MG CR tablet TAKE ONE TABLET DAILY AS NEEDED FOR PALPITATIONS 90 tablet 3   No current facility-administered medications on file prior to visit.    ALLERGIES: Allergies  Allergen Reactions  . Remicade [Infliximab] Shortness Of Breath  . Entocort Ec [Budesonide] Other (See Comments)    Lowers Magnesium level  . Codeine Nausea Only  . Esomeprazole Magnesium     Drops magnesium and potassium   . Zolpidem Tartrate Other (See Comments)    Sleep walking   . Cephalexin Rash    FAMILY HISTORY: Family History  Problem Relation Age of Onset  . Hypertension Father     SOCIAL HISTORY: Social History   Socioeconomic History  . Marital status: Married    Spouse name: Not on file  . Number of children: Not on file  . Years of education: Not on file  . Highest education level: Not on file  Occupational History  . Not on file  Tobacco Use  . Smoking status: Former Smoker    Packs/day: 1.50    Years: 40.00    Pack years: 60.00    Types: Cigarettes    Quit date: 2010    Years since quitting: 11.5  . Smokeless tobacco: Never Used  Vaping Use  . Vaping Use: Never used  Substance and Sexual Activity  . Alcohol use: Yes    Comment: seldom  . Drug use: Never  . Sexual activity: Not on file  Other Topics Concern  . Not on file  Social History Narrative  . Not on file   Social Determinants of Health   Financial Resource Strain:   . Difficulty of Paying Living Expenses:   Food Insecurity:   . Worried About Charity fundraiser in the Last Year:   . Arboriculturist in the Last Year:   Transportation Needs:   . Film/video editor (Medical):   Marland Kitchen Lack of Transportation (Non-Medical):   Physical Activity:   . Days of Exercise per Week:   . Minutes of Exercise per Session:   Stress:   . Feeling of Stress :   Social Connections:   . Frequency of Communication with Friends and Family:   . Frequency of  Social Gatherings with Friends and Family:   . Attends Religious Services:   . Active Member of Clubs or Organizations:   . Attends Archivist Meetings:   Marland Kitchen Marital Status:   Intimate Partner Violence:   . Fear of Current or Ex-Partner:   . Emotionally Abused:   Marland Kitchen Physically Abused:   . Sexually Abused:     PHYSICAL EXAM: Blood pressure 116/80, pulse 83, resp. rate  18, height 5' 3"  (1.6 m), weight 144 lb (65.3 kg), SpO2 97 %. General: No acute distress.  Patient appears well-groomed.   Head:  Normocephalic/atraumatic Eyes:  fundi examined but not visualized Neck: supple, no paraspinal tenderness, full range of motion Back: No paraspinal tenderness Heart: regular rate and rhythm Lungs: Clear to auscultation bilaterally. Vascular: No carotid bruits. Neurological Exam: Mental status: alert and oriented to person, place, and time, recent and remote memory intact, fund of knowledge intact, attention and concentration intact, speech fluent and not dysarthric, language intact. Cranial nerves: CN I: not tested CN II: pupils equal, round and reactive to light, visual fields intact CN III, IV, VI:  full range of motion, no nystagmus, no ptosis CN V: facial sensation intact CN VII: upper and lower face symmetric CN VIII: hearing intact CN IX, X: gag intact, uvula midline CN XI: sternocleidomastoid and trapezius muscles intact CN XII: tongue midline Bulk & Tone: normal, no fasciculations. Motor:  5/5 throughout Sensation:  Pinprick and vibration sensation intact. Deep Tendon Reflexes:  2+ throughout, toes downgoing.  Finger to nose testing:  Without dysmetria.  Heel to shin:  Without dysmetria.  Gait:  Normal station and stride.  Able to turn and tandem walk. Romberg negative.  IMPRESSION: 1.  Muscle weakness of extremities 2.  Numbness and tingling  PLAN: 1.  Check labs:  ANA, sed rate, CRP, TSH, CK, aldolase. 2.  NCV-EMG left upper and lower extremities 3.  Follow up  after testing.  Further recommendations pending results.  Thank you for allowing me to take part in the care of this patient.  Metta Clines, DO  CC:  Gaynelle Arabian, MD  07/13/2020 ADDENDUM:  Labs, including ANA, sed rate, CRP, TSH, CK and aldolase are unremarkable.  NCV-EMG performed on 07/06/2020 demonstrated very mild left carpal tunnel syndrome, which may explain left arm numbness, but no evidence of polyneuropathy, myopathy or lumbosacral radiculopathy to explain subjective lower extremity weakness and numbness.  On exam, she exhibited no upper motor neuron signs or objective weakness to suggest myelopathy, such as due to cervical spinal stenosis.  In light of her unremarkable neurologic exam and unremarkable test findings, I have no explanation for her symptoms.  I recommended that she follow up with her PCP. Metta Clines, DO

## 2020-06-20 ENCOUNTER — Ambulatory Visit: Payer: BC Managed Care – PPO | Admitting: Neurology

## 2020-06-20 ENCOUNTER — Other Ambulatory Visit: Payer: Self-pay

## 2020-06-20 ENCOUNTER — Encounter: Payer: Self-pay | Admitting: Neurology

## 2020-06-20 ENCOUNTER — Other Ambulatory Visit (INDEPENDENT_AMBULATORY_CARE_PROVIDER_SITE_OTHER): Payer: BC Managed Care – PPO

## 2020-06-20 VITALS — BP 116/80 | HR 83 | Resp 18 | Ht 63.0 in | Wt 144.0 lb

## 2020-06-20 DIAGNOSIS — R200 Anesthesia of skin: Secondary | ICD-10-CM

## 2020-06-20 DIAGNOSIS — R202 Paresthesia of skin: Secondary | ICD-10-CM | POA: Diagnosis not present

## 2020-06-20 DIAGNOSIS — R531 Weakness: Secondary | ICD-10-CM

## 2020-06-20 LAB — CK: Total CK: 53 U/L (ref 7–177)

## 2020-06-20 LAB — C-REACTIVE PROTEIN: CRP: <1 mg/dL (ref 0.5–20.0)

## 2020-06-20 LAB — SEDIMENTATION RATE: Sed Rate: 16 mm/hr (ref 0–30)

## 2020-06-20 LAB — TSH: TSH: 1.13 u[IU]/mL (ref 0.35–4.50)

## 2020-06-20 NOTE — Patient Instructions (Addendum)
1.  Check labs:  ANA, sed rate, CRP, TSH, CK, aldolase. Your provider has requested that you have labwork completed today. Please go to [facility] ([id number]) on the second floor of this building before leaving the office today. You do not need to check in. If you are not called within 15 minutes please check with the front desk.  2.  Nerve conduction study left arm and leg 3.  Follow up after testing.  Further recommendations pending results.

## 2020-06-21 LAB — ALDOLASE: Aldolase: 5.5 U/L (ref <=8.1)

## 2020-06-21 LAB — ANA W/REFLEX: Anti Nuclear Antibody (ANA): NEGATIVE

## 2020-06-23 ENCOUNTER — Telehealth: Payer: Self-pay

## 2020-07-06 ENCOUNTER — Other Ambulatory Visit: Payer: Self-pay

## 2020-07-06 ENCOUNTER — Ambulatory Visit (INDEPENDENT_AMBULATORY_CARE_PROVIDER_SITE_OTHER): Payer: BC Managed Care – PPO | Admitting: Neurology

## 2020-07-06 DIAGNOSIS — G5602 Carpal tunnel syndrome, left upper limb: Secondary | ICD-10-CM

## 2020-07-06 DIAGNOSIS — R531 Weakness: Secondary | ICD-10-CM | POA: Diagnosis not present

## 2020-07-06 DIAGNOSIS — R202 Paresthesia of skin: Secondary | ICD-10-CM

## 2020-07-06 DIAGNOSIS — R200 Anesthesia of skin: Secondary | ICD-10-CM

## 2020-07-06 NOTE — Procedures (Signed)
[facility]  [address]  [place], [place] [address] Tel: [phone] Fax:  [phone] Test Date:  07/06/2020  Patient: Cheryl Ferguson DOB: [birth year] Physician: Narda Amber, DO  Sex: Female Height: 5' 3"  Ref Phys: Metta Clines, D.O.  ID#: [id number] Temp: 33.0C Technician:    Patient Complaints: This is a 64 year old female referred for evaluation of generalized weakness and paresthesias.  NCV & EMG Findings: Extensive electrodiagnostic testing of the left upper and lower extremity shows:  1. Left median/ulnar (palm) comparison nerve shows abnormal peak latency difference (Median Palm-Ulnar Palm, 0.6 ms).  Left median, ulnar, sural, and superficial peroneal sensory responses are within normal limits.   2. Left median, ulnar, peroneal, and tibial motor responses are within normal limits.   3. Left tibial H reflex study is within normal limits.   4. There is no evidence of active or chronic motor axonal loss changes affecting any of the tested muscles.  Motor unit configuration and recruitment pattern is within normal limits.    Impression: 1. Left median neuropathy at or distal to the wrist (very mild), consistent with a clinical diagnosis of carpal tunnel syndrome.  2. There is no evidence of a generalized sensorimotor polyneuropathy, diffuse myopathy, or cervical/lumbosacral radiculopathy affecting the left side.   ___________________________ Narda Amber, DO    Nerve Conduction Studies Anti Sensory Summary Table   Stim Site NR Peak (ms) Norm Peak (ms) P-T Amp (V) Norm P-T Amp  Left Median Anti Sensory (2nd Digit)  33C  Wrist    2.9 <3.8 36.9 >10  Left Sup Peroneal Anti Sensory (Ant Lat Mall)  33C  12 cm    2.1 <4.6 14.7 >3  Left Sural Anti Sensory (Lat Mall)  33C  Calf    3.5 <4.6 12.1 >3  Left Ulnar Anti Sensory (5th Digit)  33C  Wrist    3.0 <3.2 29.4 >5   Motor Summary Table   Stim Site NR Onset (ms) Norm Onset (ms) O-P Amp (mV) Norm  O-P Amp Site1 Site2 Delta-0 (ms) Dist (cm) Vel (m/s) Norm Vel (m/s)  Left Median Motor (Abd Poll Brev)  33C  Wrist    2.8 <4.0 9.3 >5 Elbow Wrist 4.9 29.0 59 >50  Elbow    7.7  9.0         Left Peroneal Motor (Ext Dig Brev)  33C  Ankle    3.0 <6.0 2.5 >2.5 B Fib Ankle 7.8 37.0 47 >40  B Fib    10.8  2.5  Poplt B Fib 1.4 8.0 57 >40  Poplt    12.2  2.5         Left Tibial Motor (Abd Hall Brev)  33C  Ankle    3.2 <6.0 15.0 >4 Knee Ankle 7.8 41.0 53 >40  Knee    11.0  10.4         Left Ulnar Motor (Abd Dig Minimi)  33C  Wrist    2.2 <3.1 10.4 >7 B Elbow Wrist 3.1 20.0 65 >50  B Elbow    5.3  9.7  A Elbow B Elbow 1.7 10.0 59 >50  A Elbow    7.0  9.4          Comparison Summary Table   Stim Site NR Peak (ms) Norm Peak (ms) P-T Amp (V) Site1 Site2 Delta-P (ms) Norm Delta (ms)  Left Median/Ulnar Palm Comparison (Wrist - 8cm)  33C  Median Palm    1.8 <2.2 107.8 Median Palm  Ulnar Palm 0.6   Ulnar Palm    1.2 <2.2 23.3       H Reflex Studies   NR H-Lat (ms) Lat Norm (ms) L-R H-Lat (ms)  Left Tibial (Gastroc)  33C     31.29 <35    EMG   Side Muscle Ins Act Fibs Psw Fasc Number Recrt Dur Dur. Amp Amp. Poly Poly. Comment  Left AntTibialis Nml Nml Nml Nml Nml Nml Nml Nml Nml Nml Nml Nml N/A  Left Gastroc Nml Nml Nml Nml Nml Nml Nml Nml Nml Nml Nml Nml N/A  Left Flex Dig Long Nml Nml Nml Nml Nml Nml Nml Nml Nml Nml Nml Nml N/A  Left RectFemoris Nml Nml Nml Nml Nml Nml Nml Nml Nml Nml Nml Nml N/A  Left GluteusMed Nml Nml Nml Nml Nml Nml Nml Nml Nml Nml Nml Nml N/A  Left 1stDorInt Nml Nml Nml Nml Nml Nml Nml Nml Nml Nml Nml Nml N/A  Left Abd Poll Brev Nml Nml Nml Nml Nml Nml Nml Nml Nml Nml Nml Nml N/A  Left PronatorTeres Nml Nml Nml Nml Nml Nml Nml Nml Nml Nml Nml Nml N/A  Left Biceps Nml Nml Nml Nml Nml Nml Nml Nml Nml Nml Nml Nml N/A  Left Triceps Nml Nml Nml Nml Nml Nml Nml Nml Nml Nml Nml Nml N/A  Left Deltoid Nml Nml Nml Nml Nml Nml Nml Nml Nml Nml Nml Nml N/A       Waveforms:

## 2020-07-11 DIAGNOSIS — K50919 Crohn's disease, unspecified, with unspecified complications: Principal | ICD-10-CM

## 2020-07-13 ENCOUNTER — Telehealth: Payer: Self-pay

## 2020-07-13 NOTE — Telephone Encounter (Signed)
Pt advised of Nerve study results

## 2020-07-13 NOTE — Telephone Encounter (Signed)
-----   Message from Pieter Partridge, DO sent at 07/13/2020 12:29 PM EDT ----- Nerve and muscle study shows very mild carpal tunnel syndrome in the left arm, which may be contributing to that numbness.  Recommend wearing wrist splint to see if symptoms improve.  Otherwise, no explanation for weakness and numbness in legs.  Recommend following up with her PCP.

## 2020-07-20 ENCOUNTER — Ambulatory Visit (INDEPENDENT_AMBULATORY_CARE_PROVIDER_SITE_OTHER)
Admission: RE | Admit: 2020-07-20 | Discharge: 2020-07-20 | Disposition: A | Discharge status: Home / Self Care | Payer: BC Managed Care – PPO | Source: Ambulatory Visit | Attending: Acute Care | Admitting: Acute Care

## 2020-07-20 ENCOUNTER — Other Ambulatory Visit: Payer: Self-pay

## 2020-07-20 DIAGNOSIS — Z87891 Personal history of nicotine dependence: Secondary | ICD-10-CM | POA: Diagnosis not present

## 2020-07-20 DIAGNOSIS — Z122 Encounter for screening for malignant neoplasm of respiratory organs: Secondary | ICD-10-CM

## 2020-07-21 ENCOUNTER — Other Ambulatory Visit: Payer: Self-pay | Admitting: *Deleted

## 2020-07-21 DIAGNOSIS — Z87891 Personal history of nicotine dependence: Secondary | ICD-10-CM

## 2020-07-21 NOTE — Progress Notes (Signed)
Please call patient and let them  know their  low dose Ct was read as a Lung RADS 2: nodules that are benign in appearance and behavior with a very low likelihood of becoming a clinically active cancer due to size or lack of growth. Recommendation per radiology is for a repeat LDCT in 12 months. . Please place order for annual  screening scan for  07/2021 and fax results to PCP. Thanks so much.

## 2020-07-28 ENCOUNTER — Encounter: Admit: 2020-07-28 | Discharge: 2020-07-28 | Payer: MEDICARE

## 2020-07-28 ENCOUNTER — Encounter: Admit: 2020-07-28 | Discharge: 2020-07-28 | Payer: MEDICARE | Attending: Gastroenterology | Primary: Gastroenterology

## 2020-07-28 DIAGNOSIS — K50919 Crohn's disease, unspecified, with unspecified complications: Principal | ICD-10-CM

## 2020-08-09 ENCOUNTER — Encounter: Payer: BC Managed Care – PPO | Admitting: Neurology

## 2020-10-27 ENCOUNTER — Encounter: Admit: 2020-10-27 | Discharge: 2020-10-28 | Payer: MEDICARE | Attending: Gastroenterology | Primary: Gastroenterology

## 2020-10-27 DIAGNOSIS — K50919 Crohn's disease, unspecified, with unspecified complications: Principal | ICD-10-CM

## 2020-10-27 DIAGNOSIS — Z6826 Body mass index (BMI) 26.0-26.9, adult: Principal | ICD-10-CM

## 2020-10-27 MED ORDER — BUDESONIDE DR - ER 3 MG CAPSULE,DELAYED,EXTENDED RELEASE
ORAL_CAPSULE | Freq: Every morning | ORAL | 1 refills | 30.00000 days | Status: CP
Start: 2020-10-27 — End: 2020-11-23

## 2020-10-28 MED ORDER — BISACODYL 5 MG TABLET,DELAYED RELEASE
ORAL_TABLET | Freq: Once | ORAL | 0 refills | 1 days | Status: CP
Start: 2020-10-28 — End: 2020-10-28
  Filled 2020-11-08: qty 238, 1d supply, fill #0

## 2020-10-28 MED ORDER — POLYETHYLENE GLYCOL 3350 17 GRAM/DOSE ORAL POWDER: 119 g | g | Freq: Once | 0 refills | 1 days | Status: AC

## 2020-10-28 MED ORDER — SIMETHICONE 125 MG CHEWABLE TABLET
ORAL_TABLET | 0 refills | 0 days | Status: CP
Start: 2020-10-28 — End: ?
  Filled 2020-11-21: qty 4, 2d supply, fill #0

## 2020-10-28 MED ORDER — POLYETHYLENE GLYCOL 3350 17 GRAM/DOSE ORAL POWDER
Freq: Once | ORAL | 0 refills | 1.00000 days | Status: CP
Start: 2020-10-28 — End: 2020-11-24

## 2020-11-08 MED FILL — BISACODYL 5 MG TABLET,DELAYED RELEASE: ORAL | 1 days supply | Qty: 2 | Fill #0

## 2020-11-08 MED FILL — CLEARLAX 17 GRAM/DOSE ORAL POWDER: 1 days supply | Qty: 119 | Fill #0 | Status: AC

## 2020-11-08 MED FILL — POLYETHYLENE GLYCOL 3350 17 GRAM/DOSE ORAL POWDER: 1 days supply | Qty: 238 | Fill #0 | Status: AC

## 2020-11-08 MED FILL — BISACODYL 5 MG TABLET,DELAYED RELEASE: 1 days supply | Qty: 2 | Fill #0 | Status: AC

## 2020-11-08 MED FILL — SIMETHICONE 125 MG CHEWABLE TABLET: 2 days supply | Qty: 4 | Fill #0 | Status: AC

## 2020-11-08 MED FILL — CLEARLAX 17 GRAM/DOSE ORAL POWDER: ORAL | 1 days supply | Qty: 119 | Fill #0

## 2020-11-10 ENCOUNTER — Encounter: Admit: 2020-11-10 | Discharge: 2020-11-11 | Payer: MEDICARE | Attending: Surgery | Primary: Surgery

## 2020-11-10 DIAGNOSIS — K50919 Crohn's disease, unspecified, with unspecified complications: Principal | ICD-10-CM

## 2020-11-10 DIAGNOSIS — Z7951 Long term (current) use of inhaled steroids: Principal | ICD-10-CM

## 2020-11-10 DIAGNOSIS — Z79899 Other long term (current) drug therapy: Principal | ICD-10-CM

## 2020-11-10 DIAGNOSIS — I472 Ventricular tachycardia: Principal | ICD-10-CM

## 2020-11-10 DIAGNOSIS — Z98891 History of uterine scar from previous surgery: Principal | ICD-10-CM

## 2020-11-10 DIAGNOSIS — Z9851 Tubal ligation status: Principal | ICD-10-CM

## 2020-11-10 DIAGNOSIS — K219 Gastro-esophageal reflux disease without esophagitis: Principal | ICD-10-CM

## 2020-11-10 DIAGNOSIS — Z9049 Acquired absence of other specified parts of digestive tract: Principal | ICD-10-CM

## 2020-11-10 DIAGNOSIS — M1990 Unspecified osteoarthritis, unspecified site: Principal | ICD-10-CM

## 2020-11-10 DIAGNOSIS — N189 Chronic kidney disease, unspecified: Principal | ICD-10-CM

## 2020-11-10 DIAGNOSIS — D62 Acute posthemorrhagic anemia: Principal | ICD-10-CM

## 2020-11-10 DIAGNOSIS — E785 Hyperlipidemia, unspecified: Principal | ICD-10-CM

## 2020-11-10 DIAGNOSIS — Z87891 Personal history of nicotine dependence: Principal | ICD-10-CM

## 2020-11-10 DIAGNOSIS — F419 Anxiety disorder, unspecified: Principal | ICD-10-CM

## 2020-11-10 DIAGNOSIS — J449 Chronic obstructive pulmonary disease, unspecified: Principal | ICD-10-CM

## 2020-11-10 DIAGNOSIS — Z86010 Personal history of colonic polyps: Principal | ICD-10-CM

## 2020-11-14 DIAGNOSIS — E785 Hyperlipidemia, unspecified: Principal | ICD-10-CM

## 2020-11-14 DIAGNOSIS — D62 Acute posthemorrhagic anemia: Principal | ICD-10-CM

## 2020-11-14 DIAGNOSIS — F419 Anxiety disorder, unspecified: Principal | ICD-10-CM

## 2020-11-14 DIAGNOSIS — Z7951 Long term (current) use of inhaled steroids: Principal | ICD-10-CM

## 2020-11-14 DIAGNOSIS — Z9049 Acquired absence of other specified parts of digestive tract: Principal | ICD-10-CM

## 2020-11-14 DIAGNOSIS — Z87891 Personal history of nicotine dependence: Principal | ICD-10-CM

## 2020-11-14 DIAGNOSIS — N189 Chronic kidney disease, unspecified: Principal | ICD-10-CM

## 2020-11-14 DIAGNOSIS — Z86010 Personal history of colonic polyps: Principal | ICD-10-CM

## 2020-11-14 DIAGNOSIS — Z98891 History of uterine scar from previous surgery: Principal | ICD-10-CM

## 2020-11-14 DIAGNOSIS — K50919 Crohn's disease, unspecified, with unspecified complications: Principal | ICD-10-CM

## 2020-11-14 DIAGNOSIS — Z79899 Other long term (current) drug therapy: Principal | ICD-10-CM

## 2020-11-14 DIAGNOSIS — M1990 Unspecified osteoarthritis, unspecified site: Principal | ICD-10-CM

## 2020-11-14 DIAGNOSIS — K219 Gastro-esophageal reflux disease without esophagitis: Principal | ICD-10-CM

## 2020-11-14 DIAGNOSIS — J449 Chronic obstructive pulmonary disease, unspecified: Principal | ICD-10-CM

## 2020-11-14 DIAGNOSIS — Z9851 Tubal ligation status: Principal | ICD-10-CM

## 2020-11-14 DIAGNOSIS — I472 Ventricular tachycardia: Principal | ICD-10-CM

## 2020-11-15 DIAGNOSIS — F419 Anxiety disorder, unspecified: Principal | ICD-10-CM

## 2020-11-15 DIAGNOSIS — Z980 Intestinal bypass and anastomosis status: Principal | ICD-10-CM

## 2020-11-15 DIAGNOSIS — Z87891 Personal history of nicotine dependence: Principal | ICD-10-CM

## 2020-11-15 DIAGNOSIS — K9189 Other postprocedural complications and disorders of digestive system: Principal | ICD-10-CM

## 2020-11-15 DIAGNOSIS — I472 Ventricular tachycardia: Principal | ICD-10-CM

## 2020-11-15 DIAGNOSIS — Z86010 Personal history of colonic polyps: Principal | ICD-10-CM

## 2020-11-15 DIAGNOSIS — Z09 Encounter for follow-up examination after completed treatment for conditions other than malignant neoplasm: Principal | ICD-10-CM

## 2020-11-15 DIAGNOSIS — J449 Chronic obstructive pulmonary disease, unspecified: Principal | ICD-10-CM

## 2020-11-15 DIAGNOSIS — Z79899 Other long term (current) drug therapy: Principal | ICD-10-CM

## 2020-11-15 DIAGNOSIS — E785 Hyperlipidemia, unspecified: Principal | ICD-10-CM

## 2020-11-15 DIAGNOSIS — K5080 Crohn's disease of both small and large intestine without complications: Principal | ICD-10-CM

## 2020-11-16 DIAGNOSIS — Z01818 Encounter for other preprocedural examination: Principal | ICD-10-CM

## 2020-11-16 MED ORDER — POLYETHYLENE GLYCOL 3350 17 GRAM/DOSE ORAL POWDER
Freq: Once | ORAL | 0 refills | 1.00000 days | Status: CP
Start: 2020-11-16 — End: 2020-11-28
  Filled 2020-11-21: qty 2, 1d supply, fill #0

## 2020-11-16 MED ORDER — POLYETHYLENE GLYCOL 3350 17 GRAM/DOSE ORAL POWDER: 238 g | g | Freq: Once | 0 refills | 1 days | Status: AC

## 2020-11-16 MED ORDER — BISACODYL 5 MG TABLET,DELAYED RELEASE
ORAL_TABLET | Freq: Once | ORAL | 0 refills | 1.00000 days | Status: CP
Start: 2020-11-16 — End: 2020-11-28

## 2020-11-21 MED FILL — POLYETHYLENE GLYCOL 3350 17 GRAM/DOSE ORAL POWDER: ORAL | 1 days supply | Qty: 238 | Fill #0

## 2020-11-21 MED FILL — POLYETHYLENE GLYCOL 3350 17 GRAM/DOSE ORAL POWDER: ORAL | 1 days supply | Qty: 119 | Fill #0

## 2020-11-21 MED FILL — BISACODYL 5 MG TABLET,DELAYED RELEASE: 1 days supply | Qty: 2 | Fill #0 | Status: AC

## 2020-11-21 MED FILL — POLYETHYLENE GLYCOL 3350 17 GRAM/DOSE ORAL POWDER: 1 days supply | Qty: 238 | Fill #0 | Status: AC

## 2020-11-21 MED FILL — POLYETHYLENE GLYCOL 3350 17 GRAM/DOSE ORAL POWDER: 1 days supply | Qty: 119 | Fill #0 | Status: AC

## 2020-11-23 MED ORDER — BUDESONIDE DR - ER 3 MG CAPSULE,DELAYED,EXTENDED RELEASE
ORAL_CAPSULE | 1 refills | 0 days | Status: CP
Start: 2020-11-23 — End: 2020-12-19

## 2020-11-24 ENCOUNTER — Encounter: Admit: 2020-11-24 | Discharge: 2020-11-25 | Payer: MEDICARE

## 2020-11-24 DIAGNOSIS — K50919 Crohn's disease, unspecified, with unspecified complications: Principal | ICD-10-CM

## 2020-11-24 DIAGNOSIS — Z01818 Encounter for other preprocedural examination: Principal | ICD-10-CM

## 2020-11-27 ENCOUNTER — Encounter: Admit: 2020-11-27 | Discharge: 2020-11-28 | Payer: MEDICARE | Attending: Family Medicine | Primary: Family Medicine

## 2020-11-27 DIAGNOSIS — E785 Hyperlipidemia, unspecified: Principal | ICD-10-CM

## 2020-11-27 DIAGNOSIS — F419 Anxiety disorder, unspecified: Principal | ICD-10-CM

## 2020-11-27 DIAGNOSIS — K9189 Other postprocedural complications and disorders of digestive system: Principal | ICD-10-CM

## 2020-11-27 DIAGNOSIS — I472 Ventricular tachycardia: Principal | ICD-10-CM

## 2020-11-27 DIAGNOSIS — Z86010 Personal history of colonic polyps: Principal | ICD-10-CM

## 2020-11-27 DIAGNOSIS — Z01818 Encounter for other preprocedural examination: Principal | ICD-10-CM

## 2020-11-27 DIAGNOSIS — Z87891 Personal history of nicotine dependence: Principal | ICD-10-CM

## 2020-11-27 DIAGNOSIS — J449 Chronic obstructive pulmonary disease, unspecified: Principal | ICD-10-CM

## 2020-11-27 DIAGNOSIS — K5080 Crohn's disease of both small and large intestine without complications: Principal | ICD-10-CM

## 2020-11-27 DIAGNOSIS — Z79899 Other long term (current) drug therapy: Principal | ICD-10-CM

## 2020-11-27 DIAGNOSIS — Z09 Encounter for follow-up examination after completed treatment for conditions other than malignant neoplasm: Principal | ICD-10-CM

## 2020-11-27 DIAGNOSIS — Z980 Intestinal bypass and anastomosis status: Principal | ICD-10-CM

## 2020-11-28 ENCOUNTER — Encounter: Admit: 2020-11-28 | Discharge: 2020-11-28 | Payer: MEDICARE

## 2020-11-28 ENCOUNTER — Encounter: Admit: 2020-11-28 | Discharge: 2020-11-28 | Payer: MEDICARE | Attending: Anesthesiology | Primary: Anesthesiology

## 2020-11-28 DIAGNOSIS — E785 Hyperlipidemia, unspecified: Principal | ICD-10-CM

## 2020-11-28 DIAGNOSIS — Z980 Intestinal bypass and anastomosis status: Principal | ICD-10-CM

## 2020-11-28 DIAGNOSIS — F419 Anxiety disorder, unspecified: Principal | ICD-10-CM

## 2020-11-28 DIAGNOSIS — Z79899 Other long term (current) drug therapy: Principal | ICD-10-CM

## 2020-11-28 DIAGNOSIS — Z09 Encounter for follow-up examination after completed treatment for conditions other than malignant neoplasm: Principal | ICD-10-CM

## 2020-11-28 DIAGNOSIS — I472 Ventricular tachycardia: Principal | ICD-10-CM

## 2020-11-28 DIAGNOSIS — K9189 Other postprocedural complications and disorders of digestive system: Principal | ICD-10-CM

## 2020-11-28 DIAGNOSIS — Z86010 Personal history of colonic polyps: Principal | ICD-10-CM

## 2020-11-28 DIAGNOSIS — J449 Chronic obstructive pulmonary disease, unspecified: Principal | ICD-10-CM

## 2020-11-28 DIAGNOSIS — K5080 Crohn's disease of both small and large intestine without complications: Principal | ICD-10-CM

## 2020-11-28 DIAGNOSIS — Z87891 Personal history of nicotine dependence: Principal | ICD-10-CM

## 2020-11-29 ENCOUNTER — Encounter
Admit: 2020-11-29 | Discharge: 2020-12-02 | Disposition: A | Discharge status: Home / Self Care | Payer: MEDICARE | Attending: Certified Registered" | Admitting: Surgery

## 2020-11-29 ENCOUNTER — Encounter
Admit: 2020-11-29 | Discharge: 2020-12-02 | Disposition: A | Discharge status: Home / Self Care | Payer: MEDICARE | Attending: Family | Admitting: Surgery

## 2020-11-29 ENCOUNTER — Encounter
Admit: 2020-11-29 | Discharge: 2020-12-02 | Disposition: A | Discharge status: Home / Self Care | Payer: MEDICARE | Attending: Anesthesiology | Admitting: Surgery

## 2020-11-29 ENCOUNTER — Ambulatory Visit
Admit: 2020-11-29 | Discharge: 2020-12-02 | Disposition: A | Discharge status: Home / Self Care | Payer: MEDICARE | Admitting: Surgery

## 2020-11-29 DIAGNOSIS — Z86010 Personal history of colonic polyps: Principal | ICD-10-CM

## 2020-11-29 DIAGNOSIS — Z7951 Long term (current) use of inhaled steroids: Principal | ICD-10-CM

## 2020-11-29 DIAGNOSIS — Z98891 History of uterine scar from previous surgery: Principal | ICD-10-CM

## 2020-11-29 DIAGNOSIS — Z9851 Tubal ligation status: Principal | ICD-10-CM

## 2020-11-29 DIAGNOSIS — Z79899 Other long term (current) drug therapy: Principal | ICD-10-CM

## 2020-11-29 DIAGNOSIS — F419 Anxiety disorder, unspecified: Principal | ICD-10-CM

## 2020-11-29 DIAGNOSIS — K50919 Crohn's disease, unspecified, with unspecified complications: Principal | ICD-10-CM

## 2020-11-29 DIAGNOSIS — D62 Acute posthemorrhagic anemia: Principal | ICD-10-CM

## 2020-11-29 DIAGNOSIS — Z87891 Personal history of nicotine dependence: Principal | ICD-10-CM

## 2020-11-29 DIAGNOSIS — E785 Hyperlipidemia, unspecified: Principal | ICD-10-CM

## 2020-11-29 DIAGNOSIS — I472 Ventricular tachycardia: Principal | ICD-10-CM

## 2020-11-29 DIAGNOSIS — N189 Chronic kidney disease, unspecified: Principal | ICD-10-CM

## 2020-11-29 DIAGNOSIS — Z9049 Acquired absence of other specified parts of digestive tract: Principal | ICD-10-CM

## 2020-11-29 DIAGNOSIS — J449 Chronic obstructive pulmonary disease, unspecified: Principal | ICD-10-CM

## 2020-11-29 DIAGNOSIS — K219 Gastro-esophageal reflux disease without esophagitis: Principal | ICD-10-CM

## 2020-11-29 DIAGNOSIS — M1990 Unspecified osteoarthritis, unspecified site: Principal | ICD-10-CM

## 2020-12-01 DIAGNOSIS — K50919 Crohn's disease, unspecified, with unspecified complications: Principal | ICD-10-CM

## 2020-12-01 DIAGNOSIS — F419 Anxiety disorder, unspecified: Principal | ICD-10-CM

## 2020-12-01 DIAGNOSIS — Z98891 History of uterine scar from previous surgery: Principal | ICD-10-CM

## 2020-12-01 DIAGNOSIS — Z79899 Other long term (current) drug therapy: Principal | ICD-10-CM

## 2020-12-01 DIAGNOSIS — J449 Chronic obstructive pulmonary disease, unspecified: Principal | ICD-10-CM

## 2020-12-01 DIAGNOSIS — M1990 Unspecified osteoarthritis, unspecified site: Principal | ICD-10-CM

## 2020-12-01 DIAGNOSIS — Z86010 Personal history of colonic polyps: Principal | ICD-10-CM

## 2020-12-01 DIAGNOSIS — Z9851 Tubal ligation status: Principal | ICD-10-CM

## 2020-12-01 DIAGNOSIS — N189 Chronic kidney disease, unspecified: Principal | ICD-10-CM

## 2020-12-01 DIAGNOSIS — I472 Ventricular tachycardia: Principal | ICD-10-CM

## 2020-12-01 DIAGNOSIS — D62 Acute posthemorrhagic anemia: Principal | ICD-10-CM

## 2020-12-01 DIAGNOSIS — E785 Hyperlipidemia, unspecified: Principal | ICD-10-CM

## 2020-12-01 DIAGNOSIS — Z87891 Personal history of nicotine dependence: Principal | ICD-10-CM

## 2020-12-01 DIAGNOSIS — Z7951 Long term (current) use of inhaled steroids: Principal | ICD-10-CM

## 2020-12-01 DIAGNOSIS — Z9049 Acquired absence of other specified parts of digestive tract: Principal | ICD-10-CM

## 2020-12-01 DIAGNOSIS — K219 Gastro-esophageal reflux disease without esophagitis: Principal | ICD-10-CM

## 2020-12-02 MED ORDER — METHOCARBAMOL 500 MG TABLET
ORAL_TABLET | Freq: Three times a day (TID) | ORAL | 0 refills | 5 days | Status: CP | PRN
Start: 2020-12-02 — End: 2020-12-06
  Filled 2020-12-02: qty 30, 5d supply, fill #0

## 2020-12-02 MED ORDER — OXYCODONE 5 MG TABLET
ORAL_TABLET | ORAL | 0 refills | 1 days | Status: CP | PRN
Start: 2020-12-02 — End: 2020-12-07
  Filled 2020-12-02: qty 10, 2d supply, fill #0

## 2020-12-02 MED FILL — OXYCODONE 5 MG TABLET: 2 days supply | Qty: 10 | Fill #0 | Status: AC

## 2020-12-02 MED FILL — METHOCARBAMOL 500 MG TABLET: 5 days supply | Qty: 30 | Fill #0 | Status: AC

## 2020-12-06 DIAGNOSIS — K649 Unspecified hemorrhoids: Principal | ICD-10-CM

## 2020-12-06 DIAGNOSIS — K50919 Crohn's disease, unspecified, with unspecified complications: Principal | ICD-10-CM

## 2020-12-06 MED ORDER — METHOCARBAMOL 500 MG TABLET
ORAL_TABLET | Freq: Three times a day (TID) | ORAL | 0 refills | 5 days | Status: CP | PRN
Start: 2020-12-06 — End: ?

## 2020-12-06 MED ORDER — HYDROCORTISONE 2.5 % TOPICAL CREAM
Freq: Two times a day (BID) | TOPICAL | 0 refills | 14 days | Status: CP
Start: 2020-12-06 — End: 2020-12-20

## 2020-12-15 ENCOUNTER — Ambulatory Visit: Admit: 2020-12-15 | Discharge: 2020-12-16 | Attending: Surgery | Primary: Surgery

## 2020-12-15 DIAGNOSIS — K50919 Crohn's disease, unspecified, with unspecified complications: Principal | ICD-10-CM

## 2020-12-19 MED ORDER — BUDESONIDE DR - ER 3 MG CAPSULE,DELAYED,EXTENDED RELEASE
ORAL_CAPSULE | 1 refills | 0 days | Status: CP
Start: 2020-12-19 — End: ?

## 2021-03-21 ENCOUNTER — Ambulatory Visit: Payer: Medicare PPO | Admitting: Podiatry

## 2021-04-04 ENCOUNTER — Ambulatory Visit: Payer: Medicare PPO | Admitting: Podiatry

## 2021-04-04 ENCOUNTER — Other Ambulatory Visit: Payer: Self-pay

## 2021-04-04 DIAGNOSIS — M722 Plantar fascial fibromatosis: Secondary | ICD-10-CM

## 2021-04-04 NOTE — Progress Notes (Signed)
   Subjective: 65 y.o. female presenting for follow-up evaluation of right plantar fascitis.  Patient has now had this plantar fasciitis for over 1 year now.  She states that for the past year she has been on anti-inflammatories of once or another secondary to Crohn's and recent colon resection.  She states that while she was on the prednisone her foot was feeling well however as soon as she discontinued the anti-inflammatories that were prescribed her heel pain immediately recurred. She is also tried different shoe modifications and arch supports to help alleviate the pain with minimal success  She presents today with an antalgic gait and would like to discuss further treatment options that are more definitive to cure her heel pain   Past Medical History:  Diagnosis Date  . Anemia   . Anxiety   . Aortic atherosclerosis ([place]) 03/19/2019   Noted in CT abd  . Arthritis   . Crohn disease ([place])   . GERD (gastroesophageal reflux disease)   . High cholesterol   . History of kidney stones 04/10/2019   57m calculus in right ureter, noted on CT abd  . Hydroureteronephrosis    Moderate, right,noted on CT abd  . Meniere disease   . Phlebitis    Bilateral lower legs  . Seasonal allergies   . Trigeminy   . Ulcer of abdomen wall (HCC)    stomach ulcers  . Ventricular tachycardia ([place] 2009     Objective: Physical Exam General: The patient is alert and oriented x3 in no acute distress.  Dermatology: Skin is warm, dry and supple bilateral lower extremities. Negative for open lesions or macerations bilateral.   Vascular: Dorsalis Pedis and Posterior Tibial pulses palpable bilateral.  Capillary fill time is immediate to all digits.  Neurological: Epicritic and protective threshold intact bilateral.   Musculoskeletal: Tenderness to palpation to the plantar aspect of the right heel along the plantar fascia. All other joints range of motion within normal limits bilateral. Strength 5/5 in all  groups bilateral.   Assessment: 1. Plantar fasciitis right  Plan of Care:  1. Patient evaluated.   2. Today we discussed the conservative versus surgical management of the presenting pathology. The patient opts for surgical management. All possible complications and details of the procedure were explained. All patient questions were answered. No guarantees were expressed or implied. 3. Authorization for surgery was initiated today. Surgery will consist of endoscopic plantar fasciotomy right 4.  Return to clinic 1 week postop    BEdrick Kins DPM [facility]  Dr. BEdrick Kins DPM    [address] [place] [address]               Office [phone] Fax [phone]

## 2021-04-07 ENCOUNTER — Telehealth: Payer: Self-pay | Admitting: Urology

## 2021-04-07 NOTE — Telephone Encounter (Signed)
DOS - 04/20/21  EPF RIGHT --- [id number]  [employer] EFFECTIVE DATE -  01/12/21   PER [place] [employer] SITE FOR CPT CODE 75643 NO PRIOR AUTH IS REQUIRED.

## 2021-04-20 ENCOUNTER — Other Ambulatory Visit: Payer: Self-pay | Admitting: Podiatry

## 2021-04-20 ENCOUNTER — Encounter: Payer: Self-pay | Admitting: Podiatry

## 2021-04-20 ENCOUNTER — Telehealth: Payer: Self-pay | Admitting: *Deleted

## 2021-04-20 DIAGNOSIS — M722 Plantar fascial fibromatosis: Secondary | ICD-10-CM | POA: Diagnosis not present

## 2021-04-20 MED ORDER — OXYCODONE-ACETAMINOPHEN 5-325 MG PO TABS: 1.0000 | ORAL_TABLET | ORAL | 0 refills | Status: DC | PRN

## 2021-04-20 MED ORDER — IBUPROFEN 800 MG PO TABS
800.0000 mg | ORAL_TABLET | Freq: Three times a day (TID) | ORAL | 1 refills | Status: DC
Start: 2021-04-20 — End: 2022-08-17

## 2021-04-20 MED ORDER — PROMETHAZINE HCL 12.5 MG PO TABS
12.5000 mg | ORAL_TABLET | Freq: Three times a day (TID) | ORAL | 0 refills | Status: DC | PRN
Start: 2021-04-20 — End: 2022-08-17

## 2021-04-20 NOTE — Telephone Encounter (Signed)
Patient's husband calling because patient is in more pain than expected and she may have waited too late to take pain meds before the nerve block wore off. She is icing but could  she take  pain medicine more often than prescribed? Please advise.

## 2021-04-20 NOTE — Progress Notes (Signed)
PRN postop 

## 2021-04-27 ENCOUNTER — Ambulatory Visit: Admit: 2021-04-27 | Discharge: 2021-04-28 | Payer: MEDICARE | Attending: Gastroenterology | Primary: Gastroenterology

## 2021-04-27 DIAGNOSIS — K50919 Crohn's disease, unspecified, with unspecified complications: Principal | ICD-10-CM

## 2021-04-27 MED ORDER — RIFAXIMIN 550 MG TABLET
ORAL_TABLET | Freq: Three times a day (TID) | ORAL | 2 refills | 18.00000 days | Status: CP
Start: 2021-04-27 — End: 2021-05-11

## 2021-04-28 ENCOUNTER — Other Ambulatory Visit: Payer: Self-pay

## 2021-04-28 ENCOUNTER — Ambulatory Visit (INDEPENDENT_AMBULATORY_CARE_PROVIDER_SITE_OTHER): Payer: Medicare PPO | Admitting: Podiatry

## 2021-04-28 DIAGNOSIS — Z9889 Other specified postprocedural states: Secondary | ICD-10-CM

## 2021-05-04 NOTE — Telephone Encounter (Signed)
Has been scheduled 04/28/21

## 2021-05-05 ENCOUNTER — Ambulatory Visit (INDEPENDENT_AMBULATORY_CARE_PROVIDER_SITE_OTHER): Payer: Medicare PPO | Admitting: Podiatry

## 2021-05-05 ENCOUNTER — Other Ambulatory Visit: Payer: Self-pay

## 2021-05-05 ENCOUNTER — Encounter: Payer: Self-pay | Admitting: Podiatry

## 2021-05-05 VITALS — BP 102/67 | HR 74 | Temp 98.2°F | Resp 16

## 2021-05-05 DIAGNOSIS — Z9889 Other specified postprocedural states: Secondary | ICD-10-CM

## 2021-05-05 NOTE — Progress Notes (Signed)
   Subjective:  Patient presents today status post EPF right foot. DOS: 04/20/2021.  Patient states that she is doing well.  She has no pain when her foot is in the cam boot.  Overall she is doing well with no new complaints at this time  Past Medical History:  Diagnosis Date  . Anemia   . Anxiety   . Aortic atherosclerosis ([place]) 03/19/2019   Noted in CT abd  . Arthritis   . Crohn disease ([place])   . GERD (gastroesophageal reflux disease)   . High cholesterol   . History of kidney stones 04/10/2019   16m calculus in right ureter, noted on CT abd  . Hydroureteronephrosis    Moderate, right,noted on CT abd  . Meniere disease   . Phlebitis    Bilateral lower legs  . Seasonal allergies   . Trigeminy   . Ulcer of abdomen wall (HCC)    stomach ulcers  . Ventricular tachycardia ([place] 2009      Objective/Physical Exam Neurovascular status intact.  Skin incisions appear to be well coapted with sutures intact. No sign of infectious process noted. No dehiscence. No active bleeding noted.  Negative for any significant edema noted to the surgical extremity  Assessment: 1. s/p EPF right. DOS: 04/20/2021   Plan of Care:  1. Patient was evaluated.  2.  Sutures removed today 3.  Compression ankle sleeve dispensed.  Wear daily 4.  Patient may slowly transition out of the cam boot into good supportive sneakers 5.  Return to clinic in 4 weeks   BEdrick Kins DPM [facility]  Dr. BEdrick Kins DPM    [address] [place] [address]               Office [phone] Fax [phone]

## 2021-05-14 NOTE — Progress Notes (Signed)
   Subjective:  Patient presents today status post endoscopic plantar fasciotomy right. DOS: 04/20/2021.  Patient states that she only has minor pain.  She denies fever chills nausea vomiting shortness of breath or chest pain.  She still wearing the cam boot as instructed.  Overall she is doing very well today  Past Medical History:  Diagnosis Date  . Anemia   . Anxiety   . Aortic atherosclerosis ([place]) 03/19/2019   Noted in CT abd  . Arthritis   . Crohn disease ([place])   . GERD (gastroesophageal reflux disease)   . High cholesterol   . History of kidney stones 04/10/2019   68m calculus in right ureter, noted on CT abd  . Hydroureteronephrosis    Moderate, right,noted on CT abd  . Meniere disease   . Phlebitis    Bilateral lower legs  . Seasonal allergies   . Trigeminy   . Ulcer of abdomen wall ([facility])    stomach ulcers  . Ventricular tachycardia ([place] 2009      Objective/Physical Exam Neurovascular status intact.  Skin incisions appear to be well coapted with sutures intact. No sign of infectious process noted. No dehiscence. No active bleeding noted.  Minimal edema noted to the surgical extremity.   Assessment: 1. s/p endoscopic plantar fasciotomy right. DOS: 04/20/2021   Plan of Care:  1. Patient was evaluated.   2. Patient may begin washing the foot. Recommend abx ointment and a bandaid.  3. Continue WB in the CAM boot 4. RTC 1 week for suture removal   BEdrick Kins DPM [facility]  Dr. BEdrick Kins DPM    [address] [place] [id number]               Office [phone] Fax [phone]

## 2021-05-19 ENCOUNTER — Encounter: Payer: Medicare PPO | Admitting: Podiatry

## 2021-05-23 ENCOUNTER — Other Ambulatory Visit (HOSPITAL_COMMUNITY): Payer: Self-pay | Admitting: *Deleted

## 2021-05-25 ENCOUNTER — Ambulatory Visit (HOSPITAL_COMMUNITY)
Admission: RE | Admit: 2021-05-25 | Discharge: 2021-05-25 | Disposition: A | Discharge status: Home / Self Care | Payer: Medicare PPO | Source: Ambulatory Visit | Attending: Family Medicine | Admitting: Family Medicine

## 2021-05-25 ENCOUNTER — Other Ambulatory Visit: Payer: Self-pay

## 2021-05-25 DIAGNOSIS — K50919 Crohn's disease, unspecified, with unspecified complications: Principal | ICD-10-CM

## 2021-05-25 DIAGNOSIS — D500 Iron deficiency anemia secondary to blood loss (chronic): Secondary | ICD-10-CM | POA: Diagnosis not present

## 2021-05-25 MED ORDER — SODIUM CHLORIDE 0.9 % IV SOLN
500.0000 mg | Freq: Once | INTRAVENOUS | Status: AC
  Administered 2021-05-25: 500 mg via INTRAVENOUS
  Filled 2021-05-25: qty 25

## 2021-05-25 MED ORDER — BISACODYL 5 MG TABLET,DELAYED RELEASE
ORAL_TABLET | Freq: Once | ORAL | 0 refills | 1.00000 days | Status: CP
Start: 2021-05-25 — End: 2021-05-25

## 2021-05-25 MED ORDER — POLYETHYLENE GLYCOL 3350 17 GRAM/DOSE ORAL POWDER
Freq: Once | ORAL | 0 refills | 1.00000 days | Status: CP
Start: 2021-05-25 — End: 2021-05-25
  Filled 2021-05-30: qty 2, 1d supply, fill #0

## 2021-05-25 MED ORDER — SIMETHICONE 125 MG CHEWABLE TABLET
ORAL_TABLET | ORAL | 0 refills | 0.00000 days | Status: CP
Start: 2021-05-25 — End: 2021-05-25
  Filled 2021-05-30: qty 4, 2d supply, fill #0

## 2021-05-25 NOTE — Progress Notes (Addendum)
Pt s/p venofer infusion, CNA reports pt c/o swelling to hands and right knee, upon assessment not swelling noted, pt denies SOB, denies chest pain, no hives, no itching, VSS see epic.  Pt instructed if symptoms worsen to seek medical help immediately

## 2021-05-30 MED FILL — POLYETHYLENE GLYCOL 3350 17 GRAM/DOSE ORAL POWDER: ORAL | 1 days supply | Qty: 119 | Fill #0

## 2021-05-30 MED FILL — POLYETHYLENE GLYCOL 3350 17 GRAM/DOSE ORAL POWDER: ORAL | 1 days supply | Qty: 238 | Fill #0

## 2021-06-05 ENCOUNTER — Encounter: Admit: 2021-06-05 | Discharge: 2021-06-05 | Payer: MEDICARE | Attending: Registered Nurse | Primary: Registered Nurse

## 2021-06-05 ENCOUNTER — Ambulatory Visit: Admit: 2021-06-05 | Discharge: 2021-06-05 | Payer: MEDICARE

## 2021-06-09 ENCOUNTER — Ambulatory Visit (INDEPENDENT_AMBULATORY_CARE_PROVIDER_SITE_OTHER): Payer: Medicare PPO | Admitting: Podiatry

## 2021-06-09 ENCOUNTER — Other Ambulatory Visit: Payer: Self-pay

## 2021-06-09 DIAGNOSIS — Z9889 Other specified postprocedural states: Secondary | ICD-10-CM

## 2021-06-20 DIAGNOSIS — K50919 Crohn's disease, unspecified, with unspecified complications: Principal | ICD-10-CM

## 2021-06-20 MED ORDER — STELARA 90 MG/ML SUBCUTANEOUS SYRINGE
SUBCUTANEOUS | 6 refills | 56.00000 days | Status: CP
Start: 2021-06-20 — End: ?
  Filled 2021-06-21: qty 1, 56d supply, fill #0

## 2021-06-21 MED ORDER — EMPTY CONTAINER
2 refills | 0 days
Start: 2021-06-21 — End: ?

## 2021-06-21 MED FILL — EMPTY CONTAINER: 90 days supply | Qty: 1 | Fill #0

## 2021-06-21 NOTE — Unmapped (Signed)
[facility]   Patient Onboarding/Medication Counseling    Patient has been on medication in the past but has not self-injected before.     Anne Stout is a 65 y.o. female with Crohn's Disease who I am counseling today on continuation of therapy.  I am speaking to the patient.    Was a Nurse, learning disability used for this call? No    Verified patient's date of birth / HIPAA.    Specialty medication(s) to be sent: Inflammatory Disorders: Stelara      Non-specialty medications/supplies to be sent: sharps container       Medications not needed at this time: n/a         Stelara (ustekinumab)    Medication & Administration     Dosage: Crohn's disease: Inject 90mg  under the skin every 8 weeks staring 8 weeks after IV induction dose    Lab tests required prior to treatment initiation:  ??? Tuberculosis: Tuberculosis screening resulted in a non-reactive Quantiferon TB Gold assay.    Administration:     Prefilled syringe  1. Gather all supplies needed for injection on a clean, flat working surface: medication syringe(s) removed from packaging, alcohol swab, sharps container, etc.  2. Look at the medication label - look for correct medication, correct dose, and check the expiration date  3. Look at the medication - the liquid in the syringe should appear clear and colorless to slightly yellow, you may see a few white particles  4. Lay the syringe on a flat surface and allow it to warm up to room temperature for at least 15-30 minutes  5. Select injection site - you can use the front of your thigh or your belly (but not the area 2 inches around your belly button); if someone else is giving you the injection you can also use your upper arm in the skin covering your triceps muscle or in the buttocks  6. Prepare injection site - wash your hands and clean the skin at the injection site with an alcohol swab and let it air dry, do not touch the injection site again before the injection  7. Pull off the needle safety cap, do not remove until immediately prior to injection  8. Pinch the skin - with your hand not holding the syringe pinch up a fold of skin at the injection site using your forefinger and thumb  9. Insert the needle into the fold of skin at about a 45 degree angle - it's best to use a quick dart-like motion  10. Push the plunger down slowly as far as it will go until the syringe is empty, if the plunger is not fully depressed the needle shield will not extend to cover the needle when it is removed, hold the syringe in place for a full 5 seconds  11. Check that the syringe is empty and keep pressing down on the plunger while you pull the needle out at the same angle as inserted; after the needle is removed completely from the skin, release the plunger allowing the needle shield to activate and cover the used needle  12. Dispose of the used syringe immediately in your sharps disposal container, do not attempt to recap the needle prior to disposing  13. If you see any blood at the injection site, press a cotton ball or gauze on the site and maintain pressure until the bleeding stops, do not rub the injection site      Adherence/Missed dose instructions:  If your  injection is given more than 7-10 days after your scheduled injection date - consult your pharmacist for additional instructions on how to adjust your dosing schedule.    Goals of Therapy     Crohn's disease  ??? Achieve remission of symptoms  ??? Maintain remission of symptoms  ??? Minimize long-term systemic glucocorticoid use  ??? Prevent need for surgical procedures  ??? Maintenance of effective psychosocial functioning      Side Effects & Monitoring Parameters     ??? Injection site reaction (redness, irritation, inflammation localized to the site of administration)  ??? Signs of a common cold - minor sore throat, runny or stuffy nose, etc.  ??? Feeling tired or weak  ??? Headache    The following side effects should be reported to the provider:  ??? Signs of a hypersensitivity reaction - rash; hives; itching; red, swollen, blistered, or peeling skin; wheezing; tightness in the chest or throat; difficulty breathing, swallowing, or talking; swelling of the mouth, face, lips, tongue, or throat; etc.  ??? Reduced immune function - report signs of infection such as fever; chills; body aches; very bad sore throat; ear or sinus pain; cough; more sputum or change in color of sputum; pain with passing urine; wound that will not heal, etc.  Also at a slightly higher risk of some malignancies (mainly skin and blood cancers) due to this reduced immune function.  o In the case of signs of infection - the patient should hold the next dose of Stelara?? and call your primary care provider to ensure adequate medical care.  Treatment may be resumed when infection is treated and patient is asymptomatic.  ??? Changes in skin - a new growth or lump that forms; changes in shape, size, or color of a previous mole or marking  ??? Shortness of breath or chest pain  ??? Vaginal itching or discharge      Contraindications, Warnings, & Precautions     ??? Have your bloodwork checked as you have been told by your prescriber  ??? Talk with your doctor if you are pregnant, planning to become pregnant, or breastfeeding  ??? Discuss the possible need for holding your dose(s) of Stelara?? when a planned procedure is scheduled with the prescriber as it may delay healing/recovery timeline       Drug/Food Interactions     ??? Medication list reviewed in Epic. The patient was instructed to inform the care team before taking any new medications or supplements. No drug interactions identified.   ??? If you have a latex allergy use caution when handling, the needle cap of the Stelara?? prefilled syringe contains a derivative of natural rubber latex  ??? Talk with you prescriber or pharmacist before receiving any live vaccinations while taking this medication and after you stop taking it    Storage, Handling Precautions, & Disposal     ??? Store this medication in the refrigerator.  Do not freeze  ??? If needed, you may store at room temperature for up to 30 days  ??? Store in original packaging, protected from light  ??? Do not shake  ??? Dispose of used syringes/pens in a sharps disposal container            Current Medications (including OTC/herbals), Comorbidities and Allergies     Current Outpatient Medications   Medication Sig Dispense Refill   ??? acetaminophen (TYLENOL 8 HOUR ORAL) Take 2 tablets by mouth every eight (8) hours.     ??? ALPRAZolam (XANAX) 0.5 MG tablet  Take 0.5 tablets by mouth daily as needed.     ??? cyanocobalamin, vitamin B-12, (B-12 COMPLIANCE) 1,000 mcg/mL Kit every thirty (30) days.     ??? famotidine (PEPCID) 40 MG tablet Take 40 mg by mouth Two (2) times a day.     ??? loratadine (CLARITIN REDITABS) 10 mg dissolvable tablet Take 10 mg by mouth daily.     ??? potassium chloride (KLOR-CON) 20 MEQ CR tablet Take 20 mEq by mouth daily.     ??? rosuvastatin (CRESTOR) 5 MG tablet Take 5 mg by mouth daily.     ??? simethicone (MYLICON) 125 MG chewable tablet Take 2 tablets (250 mg) at 5:00 pm the day before your procedure. Take remaining 2 tablets (250 mg) at least 4 hours before your procedure time. 4 tablet 0   ??? ustekinumab (STELARA) 45 mg/0.5 mL Syrg syringe Inject 90 mg under the skin every 8 weeks.      ??? ustekinumab (STELARA) 90 mg/mL Syrg syringe Inject the contents of 1 syringe (90 mg total) under the skin every 8 weeks. 1 mL 6   ??? verapamiL (CALAN-SR) 240 MG CR tablet daily.       No current facility-administered medications for this visit.       Allergies   Allergen Reactions   ??? Cephalexin Hives, Itching, Nausea Only and Rash   ??? Infliximab Shortness Of Breath, Anxiety and Palpitations     (Remicade)   ??? Esomeprazole Magnesium      Drops magnesium and potassium   ??? Zolpidem Confusion     Other reaction(s): Other (See Comments)  Sleep walking     ??? Clindamycin Nausea Only and Other (See Comments)     Severe Nausea / diarrhea   ??? Codeine Nausea Only ??? Trazodone Palpitations       There is no problem list on file for this patient.      Reviewed and up to date in Epic.    Appropriateness of Therapy     Acute infections noted within Epic:  No active infections  Patient reported infection: None    Is medication and dose appropriate based on diagnosis and infection status? Yes    Prescription has been clinically reviewed: Yes      Baseline Quality of Life Assessment      How many days over the past month did your Crohn's  keep you from your normal activities? For example, brushing your teeth or getting up in the morning. 0    Financial Information     Medication Assistance provided: Prior Authorization    Anticipated copay of $200 reviewed with patient. Verified delivery address.    Delivery Information     Scheduled delivery date: 7/14    Expected start date: 7/15    Medication will be delivered via UPS to the prescription address in [facility].  This shipment will not require a signature.      Explained the services we provide at [facility] and that each month we would call to set up refills.  Stressed importance of returning phone calls so that we could ensure they receive their medications in time each month.  Informed patient that we should be setting up refills 7-10 days prior to when they will run out of medication.  A pharmacist will reach out to perform a clinical assessment periodically.  Informed patient that a welcome packet, containing information about our pharmacy and other support services, a Notice of Privacy Practices, and a  drug information handout will be sent.      The patient or caregiver noted above participated in the development of this care plan and knows that they can request review of or adjustments to the care plan at any time.      Patient or caregiver verbalized understanding of the above information as well as how to contact the pharmacy at [phone] option 4 with any questions/concerns.  The pharmacy is open Monday through Friday 8:30am-4:30pm.  A pharmacist is available 24/7 via pager to answer any clinical questions they may have.    Patient Specific Needs     - Does the patient have any physical, cognitive, or cultural barriers? No    - Does the patient have adequate living arrangements? (i.e. the ability to store and take their medication appropriately) Yes    - Did you identify any home environmental safety or security hazards? No    - Patient prefers to have medications discussed with  Patient     - Is the patient or caregiver able to read and understand education materials at a high school level or above? Yes    - Patient's primary language is  English     - Is the patient high risk? No    - Does the patient require physician intervention or other additional services (i.e. dietary/nutrition, smoking cessation, social work)? No      Anne Stout  [facility] Specialty Pharmacist

## 2021-06-21 NOTE — Unmapped (Signed)
[facility] Specialty Medication Onboarding    Specialty Medication: Stelara 90mg /ml syringe  Prior Authorization: Approved   Financial Assistance: MAPs looking into it  Final Copay/Day Supply: $200 / 28 days    Insurance Restrictions: Yes - max 1 month supply     Notes to Pharmacist:     The triage team has completed the benefits investigation and has determined that the patient is able to fill this medication at [facility]. Please contact the patient to complete the onboarding or follow up with the prescribing physician as needed.

## 2021-06-26 DIAGNOSIS — D500 Iron deficiency anemia secondary to blood loss (chronic): Secondary | ICD-10-CM | POA: Diagnosis not present

## 2021-06-29 ENCOUNTER — Telehealth: Payer: Self-pay | Admitting: Oncology

## 2021-06-29 NOTE — Telephone Encounter (Signed)
Patient referred by Dr Gaynelle Arabian for Iron Def/Anemia.  Appt made for 07/27/21 Labs 1:30 pm - Consult 2:00 pm

## 2021-06-29 NOTE — Progress Notes (Signed)
   Subjective:  Patient presents today status post EPF right foot. DOS: 04/20/2021.  Patient states that she is doing well.  Patient states that the pain is not constant and only intermittent.  Occasionally she will have some swelling.  She states that the cam boot helped significantly when the patient is on her feet for a prolonged period of time.  Past Medical History:  Diagnosis Date   Anemia    Anxiety    Aortic atherosclerosis ([place]) 03/19/2019   Noted in CT abd   Arthritis    Crohn disease ([place])    GERD (gastroesophageal reflux disease)    High cholesterol    History of kidney stones 04/10/2019   71m calculus in right ureter, noted on CT abd   Hydroureteronephrosis    Moderate, right,noted on CT abd   Meniere disease    Phlebitis    Bilateral lower legs   Seasonal allergies    Trigeminy    [place]    stomach ulcers   [place] 2009      Objective/Physical Exam Neurovascular status intact.  Skin incisions appear to be well coapted and healed.  No sign of infectious process noted. No dehiscence. No active bleeding noted.  Negative for any significant edema noted to the surgical extremity  Assessment: 1. s/p EPF right. DOS: 04/20/2021   Plan of Care:  1. Patient was evaluated.  2.  Overall the patient seems to be doing very well 3.  Patient may slowly transition back to full activity with no restrictions 4.  Recommend good supportive shoes and sneakers 5.  Return to clinic in 3 months for final follow-up  BEdrick Kins DPM [facility]  Dr. BEdrick Kins DPM    [address] [place] [id number]               Office [phone] Fax [phone]

## 2021-06-30 DIAGNOSIS — Z01419 Encounter for gynecological examination (general) (routine) without abnormal findings: Secondary | ICD-10-CM | POA: Diagnosis not present

## 2021-07-03 DIAGNOSIS — E538 Deficiency of other specified B group vitamins: Secondary | ICD-10-CM | POA: Diagnosis not present

## 2021-07-13 DIAGNOSIS — K5090 Crohn's disease, unspecified, without complications: Secondary | ICD-10-CM | POA: Diagnosis not present

## 2021-07-14 ENCOUNTER — Telehealth: Payer: Self-pay | Admitting: Acute Care

## 2021-07-14 NOTE — Telephone Encounter (Signed)
I have spoke with Mrs. Ozier and her LCS CT has been scheduled at [place] on 07/21/2021 @ 9:30am and she is aware

## 2021-07-19 NOTE — Unmapped (Signed)
Patient approved for JJPAF for stelara and will proceed with obtaining medication from [facility] under this program. She was able to self inject with help for stelara nurse navigator and feels confident in continuing independently.  She was also approved for [facility] but will discontinue these orders now to avoid duplication/confusion.  Patient verbalized understanding and in agreement with plan.

## 2021-07-21 ENCOUNTER — Ambulatory Visit
Admission: RE | Admit: 2021-07-21 | Discharge: 2021-07-21 | Disposition: A | Discharge status: Home / Self Care | Payer: Medicare PPO | Source: Ambulatory Visit | Attending: Acute Care | Admitting: Acute Care

## 2021-07-21 ENCOUNTER — Other Ambulatory Visit: Payer: Self-pay

## 2021-07-21 DIAGNOSIS — Z87891 Personal history of nicotine dependence: Secondary | ICD-10-CM | POA: Insufficient documentation

## 2021-07-21 DIAGNOSIS — Z122 Encounter for screening for malignant neoplasm of respiratory organs: Secondary | ICD-10-CM | POA: Diagnosis not present

## 2021-07-25 ENCOUNTER — Telehealth: Payer: Self-pay | Admitting: Oncology

## 2021-07-25 NOTE — Telephone Encounter (Signed)
Patient called to verify 8/18 Appt's

## 2021-07-26 ENCOUNTER — Other Ambulatory Visit: Payer: Self-pay | Admitting: Oncology

## 2021-07-26 DIAGNOSIS — D508 Other iron deficiency anemias: Secondary | ICD-10-CM

## 2021-07-26 NOTE — Progress Notes (Signed)
[facility]  [address] [place],  [place]  [address] [phone]  Clinic Day:  07/27/2021  Referring physician: Gaynelle Arabian, MD   HISTORY OF PRESENT ILLNESS:  The patient is a 65 y.o. female who I was asked to consult upon for iron deficiency anemia.  Labs in June 2022 showed a low hemoglobin of 9.7.  Iron studies showed a low ferritin of 8.9, a low serum iron of 25, an elevated TIBC of 553 and a low iron saturation of 4%.  This led to her receiving IV Venofer, from which she had an allergic reaction.  However, her labs improved after receiving that iron formulation.  She denies having any overt forms of blood loss over these past months to explain her iron deficiency anemia.  Of note, she has Crohn's disease for which she has had 2 partial bowel resections.   The last one was in December 2021.  She is not taking any iron pills.  To her knowledge, there is no family history of anemia or any hematologic disorders.  PAST MEDICAL HISTORY:   Past Medical History:  Diagnosis Date   Anemia    Anxiety    Aortic atherosclerosis ([facility]) 03/19/2019   Noted in CT abd   Arthritis    Crohn disease ([facility])    GERD (gastroesophageal reflux disease)    High cholesterol    History of kidney stones 04/10/2019   67m calculus in right ureter, noted on CT abd   Hydroureteronephrosis    Moderate, right,noted on CT abd   Meniere disease    Phlebitis    Bilateral lower legs   Seasonal allergies    Trigeminy    Ulcer of abdomen wall ([facility])    stomach ulcers   Ventricular tachycardia ([facility] 2009    PAST SURGICAL HISTORY:   Past Surgical History:  Procedure Laterality Date   APPENDECTOMY     BOWEL RESECTION     CESAREAN SECTION     COLONOSCOPY  03/2019   EXTRACORPOREAL SHOCK WAVE LITHOTRIPSY Right 04/16/2019   Procedure: EXTRACORPOREAL SHOCK WAVE LITHOTRIPSY (ESWL);  Surgeon: HArdis Hughs MD;  Location: [facility];  Service: Urology;  Laterality: Right;    FOOT SURGERY Left    OVARIAN CYST REMOVAL     TONSILLECTOMY     UPPER GI ENDOSCOPY      CURRENT MEDICATIONS:   Current Outpatient Medications  Medication Sig Dispense Refill   acetaminophen (TYLENOL 8 HOUR ARTHRITIS PAIN) 650 MG CR tablet      ALPRAZolam (XANAX) 0.5 MG tablet Take by mouth.     famotidine (PEPCID) 40 MG tablet Take by mouth.     Loratadine (CLARITIN) 10 MG CAPS      simethicone (MYLICON) 1389MG chewable tablet Take 2 tablets (250 mg) at 5:00 pm the day before your procedure. Take remaining 2 tablets (250 mg) at least 4 hours before your procedure time.     ustekinumab (STELARA) 90 MG/ML SOSY injection Inject into the skin.     ALPRAZolam (XANAX) 0.5 MG tablet every 8 (eight) hours as needed.      B-12, Methylcobalamin, 1000 MCG SUBL Inject as directed every 30 (thirty) days. Pt unsure of dosage given.     cyanocobalamin (,VITAMIN B-12,) 1000 MCG/ML injection every thirty (30) days.     famotidine (PEPCID) 40 MG tablet Take 40 mg by mouth 2 (two) times daily.     HUMIRA PEN 40 MG/0.8ML PNKT every 14 (fourteen) days.  ibuprofen (ADVIL) 800 MG tablet Take 1 tablet (800 mg total) by mouth 3 (three) times daily. 90 tablet 1   ondansetron (ZOFRAN) 8 MG tablet PLEASE SEE ATTACHED FOR DETAILED DIRECTIONS     oxyCODONE-acetaminophen (PERCOCET) 5-325 MG tablet Take 1 tablet by mouth every 4 (four) hours as needed for severe pain. 30 tablet 0   PENTASA 500 MG CR capsule 4 (four) times daily.      Potassium Chloride ER 20 MEQ TBCR Take 1 tablet by mouth 2 (two) times daily.     potassium chloride SA (K-DUR) 20 MEQ tablet Take 20 mEq by mouth daily.     promethazine (PHENERGAN) 12.5 MG tablet Take 1 tablet (12.5 mg total) by mouth every 8 (eight) hours as needed for nausea or vomiting. 20 tablet 0   rosuvastatin (CRESTOR) 5 MG tablet Take 5 mg by mouth daily. 3 pm     ustekinumab (STELARA) 45 MG/0.5ML SOSY injection See admin instructions.     VALTREX 500 MG tablet Take 1 po bid  x 5 days first sign of flare     verapamil (CALAN-SR) 240 MG CR tablet TAKE ONE TABLET DAILY AS NEEDED FOR PALPITATIONS 90 tablet 3   No current facility-administered medications for this visit.    ALLERGIES:   Allergies  Allergen Reactions   Iron Sucrose     Feet/legs/hand/face swelling, nausea, headaches,    Remicade [Infliximab] Shortness Of Breath   Entocort Ec [Budesonide] Other (See Comments)    Lowers Magnesium level   Codeine Nausea Only   Esomeprazole Magnesium     Drops magnesium and potassium    Zolpidem     Other reaction(s): Confusion Other reaction(s): Other (See Comments) Sleep walking   Zolpidem Tartrate Other (See Comments)    Sleep walking    Cephalexin Rash   Clindamycin Nausea Only    Other reaction(s): Other (See Comments) Severe Nausea / diarrhea   Trazodone Palpitations    FAMILY HISTORY:   Family History  Problem Relation Age of Onset   Breast cancer Mother    Thyroid disease Mother    Hypertension Father    Hyperlipidemia Father    Ulcers Father    Stomach cancer Sister    Diabetes Brother    Hyperlipidemia Brother    Hypertension Brother    Hyperlipidemia Brother    Hypertension Brother    Hyperlipidemia Brother    Hypertension Brother    Breast cancer Maternal Aunt    Lymphoma Maternal Aunt    Colon cancer Paternal Uncle    Stomach cancer Paternal Uncle    Breast cancer Cousin    Breast cancer Cousin   Paternal aunt with breast cancer.  SOCIAL HISTORY:  The patient was born in [place].  She lives in [place] with her 2nd husband of 5 years.  She has 2 children.  She worked in the [employer] for 30 years.  She smoked a pack of cigarettes daily for 35 years before quitting 12 years ago.  She drinks alcohol on rare occasions.    REVIEW OF SYSTEMS:  Review of Systems  Constitutional:  Positive for fatigue. Negative for fever.  HENT:   Negative for hearing loss and sore throat.   Eyes:  Negative for eye  problems.  Respiratory:  Negative for chest tightness, cough and hemoptysis.   Cardiovascular:  Positive for palpitations. Negative for chest pain.  Gastrointestinal:  Positive for nausea. Negative for abdominal distention, abdominal pain, blood in stool, constipation, diarrhea and  vomiting.  Endocrine: Negative for hot flashes.  Genitourinary:  Negative for difficulty urinating, dysuria, frequency, hematuria and nocturia.   Musculoskeletal:  Positive for myalgias. Negative for arthralgias, back pain and gait problem.  Skin: Negative.  Negative for itching and rash.  Neurological: Negative.  Negative for dizziness, extremity weakness, gait problem, headaches, light-headedness and numbness.  Hematological: Negative.   Psychiatric/Behavioral:  Positive for depression. Negative for suicidal ideas. The patient is not nervous/anxious.     PHYSICAL EXAM:  Blood pressure 122/66, pulse 72, temperature 97.8 F (36.6 C), resp. rate 16, height 5' 3"  (1.6 m), weight 147 lb 11.2 oz (67 kg), SpO2 99 %. Wt Readings from Last 3 Encounters:  07/27/21 147 lb 11.2 oz (67 kg)  07/21/21 145 lb (65.8 kg)  06/20/20 144 lb (65.3 kg)   Body mass index is 26.16 kg/m. Performance status (ECOG): 0 - Asymptomatic Physical Exam Constitutional:      Appearance: Normal appearance. She is not ill-appearing.  HENT:     Mouth/Throat:     Mouth: Mucous membranes are moist.     Pharynx: Oropharynx is clear. No oropharyngeal exudate or posterior oropharyngeal erythema.  Cardiovascular:     Rate and Rhythm: Normal rate and regular rhythm.     Heart sounds: No murmur heard.   No friction rub. No gallop.  Pulmonary:     Effort: Pulmonary effort is normal. No respiratory distress.     Breath sounds: Normal breath sounds. No wheezing, rhonchi or rales.  Abdominal:     General: Bowel sounds are normal. There is no distension.     Palpations: Abdomen is soft. There is no mass.     Tenderness: There is no abdominal  tenderness.  Musculoskeletal:        General: No swelling.     Right lower leg: No edema.     Left lower leg: No edema.  Lymphadenopathy:     Cervical: No cervical adenopathy.     Upper Body:     Right upper body: No supraclavicular or axillary adenopathy.     Left upper body: No supraclavicular or axillary adenopathy.     Lower Body: No right inguinal adenopathy. No left inguinal adenopathy.  Skin:    General: Skin is warm.     Coloration: Skin is not jaundiced.     Findings: No lesion or rash.  Neurological:     General: No focal deficit present.     Mental Status: She is alert and oriented to person, place, and time. Mental status is at baseline.     Cranial Nerves: Cranial nerves are intact.  Psychiatric:        Mood and Affect: Mood normal.        Behavior: Behavior normal.        Thought Content: Thought content normal.   LABS:    Ref. Range 07/27/2021 00:00  Sodium Latest Ref Range: 137 - 147  139  Potassium Latest Ref Range: 3.4 - 5.3  3.8  Chloride Latest Ref Range: 99 - 108  108  CO2 Latest Ref Range: 13 - 22  22  Glucose Unknown 115  BUN Latest Ref Range: 4 - 21  13  Creatinine Latest Ref Range: 0.5 - 1.1  1.0  Calcium Latest Ref Range: 8.7 - 10.7  9.4  Alkaline Phosphatase Latest Ref Range: 25 - 125  95  Albumin Latest Ref Range: 3.5 - 5.0  4.4  AST Latest Ref Range: 13 - 35  47 (A)  ALT Latest Ref Range: 7 - 35  33  Bilirubin, Total Unknown 0.4    Ref. Range 07/27/2021 13:40  Iron Latest Ref Range: 28 - 170 ug/dL 44  UIBC Latest Units: ug/dL 463  TIBC Latest Ref Range: 250 - 450 ug/dL 507 (H)  Saturation Ratios Latest Ref Range: 10.4 - 31.8 % 9 (L)  Ferritin Latest Ref Range: 11 - 307 ng/mL 24  Folate Latest Ref Range: >5.9 ng/mL 13.5    ASSESSMENT & PLAN:  A 65 y.o. female who I was asked to consult upon for iron deficiency.  As mentioned previously, she recently received IV iron, which has led to an improvement in her hemoglobin. Although her iron  parameters are improved, they are not completely ideal.  As she had an allergy to recent IV Venofer, I have no problem with her taking iron tablets over these next few months to further assist with the refortification of her iron stores.  Clinically, the patient is doing well.   As that is the case, I will see her back in 4 months for repeat clinical assessment.  The patient understands all the plans discussed today and is in agreement with them.  I do appreciate Gaynelle Arabian, MD for his new consult.   Sung Renton Macarthur Critchley, MD

## 2021-07-27 ENCOUNTER — Encounter: Payer: Self-pay | Admitting: Oncology

## 2021-07-27 ENCOUNTER — Inpatient Hospital Stay: Payer: Medicare PPO | Attending: Oncology

## 2021-07-27 ENCOUNTER — Other Ambulatory Visit: Payer: Self-pay | Admitting: Oncology

## 2021-07-27 ENCOUNTER — Telehealth: Payer: Self-pay | Admitting: Oncology

## 2021-07-27 ENCOUNTER — Other Ambulatory Visit: Payer: Self-pay

## 2021-07-27 ENCOUNTER — Inpatient Hospital Stay: Payer: Medicare PPO | Admitting: Oncology

## 2021-07-27 DIAGNOSIS — D508 Other iron deficiency anemias: Secondary | ICD-10-CM

## 2021-07-27 DIAGNOSIS — D509 Iron deficiency anemia, unspecified: Secondary | ICD-10-CM | POA: Diagnosis not present

## 2021-07-27 LAB — BASIC METABOLIC PANEL
BUN: 13 (ref 4–21)
CO2: 22 (ref 13–22)
Chloride: 108 (ref 99–108)
Creatinine: 1 (ref 0.5–1.1)
Glucose: 115
Potassium: 3.8 (ref 3.4–5.3)
Sodium: 139 (ref 137–147)

## 2021-07-27 LAB — IRON AND TIBC
Iron: 44 ug/dL (ref 28–170)
Saturation Ratios: 9 % — ABNORMAL LOW (ref 10.4–31.8)
TIBC: 507 ug/dL — ABNORMAL HIGH (ref 250–450)
UIBC: 463 ug/dL

## 2021-07-27 LAB — CBC AND DIFFERENTIAL
HCT: 36 (ref 36–46)
Hemoglobin: 11.4 — AB (ref 12.0–16.0)
Neutrophils Absolute: 3.65
Platelets: 264 (ref 150–399)
WBC: 5.7

## 2021-07-27 LAB — COMPREHENSIVE METABOLIC PANEL
Albumin: 4.4 (ref 3.5–5.0)
Calcium: 9.4 (ref 8.7–10.7)

## 2021-07-27 LAB — HEPATIC FUNCTION PANEL
ALT: 33 (ref 7–35)
AST: 47 — AB (ref 13–35)
Alkaline Phosphatase: 95 (ref 25–125)
Bilirubin, Total: 0.4

## 2021-07-27 LAB — VITAMIN B12: Vitamin B-12: 265 pg/mL (ref 180–914)

## 2021-07-27 LAB — FERRITIN: Ferritin: 24 ng/mL (ref 11–307)

## 2021-07-27 LAB — CBC: RBC: 4.02 (ref 3.87–5.11)

## 2021-07-27 LAB — FOLATE: Folate: 13.5 ng/mL (ref >5.9)

## 2021-07-27 NOTE — Telephone Encounter (Signed)
Per 8/18 LOS, patient scheduled for Dec Appt's.  Patient entered Appt's in phone

## 2021-07-28 ENCOUNTER — Telehealth: Payer: Self-pay

## 2021-07-28 NOTE — Progress Notes (Signed)
Please call patient and let them  know their  low dose Ct was read as a Lung RADS 2: nodules that are benign in appearance and behavior with a very low likelihood of becoming a clinically active cancer due to size or lack of growth. Recommendation per radiology is for a repeat LDCT in 12 months. .Please let them  know we will order and schedule their  annual screening scan for 07/2022. Pt.  is  currently on statin therapy. Please place order for annual  screening scan for  07/2022 and fax results to PCP. Thanks so much.

## 2021-07-28 NOTE — Telephone Encounter (Addendum)
@  1122 - I spoke with pt and notified her of Dr Bobby Rumpf' response below. I educated her that iron can cause constipation and stomach upset. I told her to take the ron with food and for her to take senna with each iron dose. She reports that she has crohn's, so she tends to have loose stools.   I clarified with Dr Bobby Rumpf on how much iron medication he wanted pt to take. He would like for her to take ferrous sulfate 330m 2 tabs po BID.   Per Dr LBobby Rumpfoffice note: ASSESSMENT & PLAN:  A 65y.o. female who I was asked to consult upon for iron deficiency.  As mentioned previously, she recently received IV iron, which has led to an improvement in her hemoglobin. Although her iron parameters are improved, they are not completely ideal.  As she had an allergy to recent IV Venofer, I have no problem with her taking iron tablets over these next few months to further assist with the refortification of her iron stores.  Clinically, the patient is doing well.

## 2021-07-31 ENCOUNTER — Encounter: Payer: Self-pay | Admitting: *Deleted

## 2021-07-31 DIAGNOSIS — Z87891 Personal history of nicotine dependence: Secondary | ICD-10-CM

## 2021-08-03 DIAGNOSIS — E538 Deficiency of other specified B group vitamins: Secondary | ICD-10-CM | POA: Diagnosis not present

## 2021-08-03 NOTE — Unmapped (Signed)
[facility] Clinical Assessment & Refill Coordination Note    Taylore Hinde [place], [place]: 02/05/1956  Phone: [phone] (home)     All above HIPAA information was verified with patient.     Was a Nurse, learning disability used for this call? No    Specialty Medication(s):   Inflammatory Disorders: Stelara     Current Outpatient Medications   Medication Sig Dispense Refill   ??? acetaminophen (TYLENOL 8 HOUR ORAL) Take 2 tablets by mouth every eight (8) hours.     ??? ALPRAZolam (XANAX) 0.5 MG tablet Take 0.5 tablets by mouth daily as needed.     ??? cyanocobalamin, vitamin B-12, (B-12 COMPLIANCE) 1,000 mcg/mL Kit every thirty (30) days.     ??? empty container Misc USE AS DIRECTED 1 each 2   ??? famotidine (PEPCID) 40 MG tablet Take 40 mg by mouth Two (2) times a day.     ??? ferrous sulfate (FEROSUL) 325 (65 FE) MG tablet Take 650 mg by mouth two (2) times a day.     ??? loratadine (CLARITIN REDITABS) 10 mg dissolvable tablet Take 10 mg by mouth daily.     ??? potassium chloride (KLOR-CON) 20 MEQ CR tablet Take 20 mEq by mouth daily.     ??? rosuvastatin (CRESTOR) 5 MG tablet Take 5 mg by mouth daily.     ??? simethicone (MYLICON) 125 MG chewable tablet Take 2 tablets (250 mg) at 5:00 pm the day before your procedure. Take remaining 2 tablets (250 mg) at least 4 hours before your procedure time. 4 tablet 0   ??? ustekinumab (STELARA) 45 mg/0.5 mL Syrg syringe Inject 90 mg under the skin every 8 weeks.  (Patient not taking: Reported on 06/21/2021)     ??? ustekinumab (STELARA) 90 mg/mL Syrg syringe Inject the contents of 1 syringe (90 mg total) under the skin every 8 weeks. 1 mL 6   ??? verapamiL (CALAN-SR) 240 MG CR tablet daily. (Patient not taking: Reported on 06/21/2021)       No current facility-administered medications for this visit.        Changes to medications: Murial reports starting the following medications: Fero-sul 325mg  BID -hematologist prescribed and will recheck iron levels in 3-4 months    Allergies Allergen Reactions   ??? Cephalexin Hives, Itching, Nausea Only and Rash   ??? Infliximab Shortness Of Breath, Anxiety and Palpitations     (Remicade)   ??? Venofer [Iron Sucrose]      Feet/legs/hand/face swelling, nausea, headaches,    ??? Esomeprazole Magnesium      Drops magnesium and potassium   ??? Zolpidem Confusion     Other reaction(s): Other (See Comments)  Sleep walking     ??? Clindamycin Nausea Only and Other (See Comments)     Severe Nausea / diarrhea   ??? Codeine Nausea Only   ??? Trazodone Palpitations       Changes to allergies: No    SPECIALTY MEDICATION ADHERENCE     Stelara 90 mg/ml: 0 days of medicine on hand     Medication Adherence    Patient reported X missed doses in the last month: 0  Specialty Medication: Stelara 90mg  - every 8 weeks  Patient is on additional specialty medications: No  Informant: patient          Specialty medication(s) dose(s) confirmed: Regimen is correct and unchanged.     Are there any concerns with adherence? No    Adherence counseling provided? Not needed  CLINICAL MANAGEMENT AND INTERVENTION      Clinical Benefit Assessment:    Do you feel the medicine is effective or helping your condition? Yes    Clinical Benefit counseling provided? Not needed    Adverse Effects Assessment:    Are you experiencing any side effects? No    Are you experiencing difficulty administering your medicine? No    Quality of Life Assessment:    Quality of Life    Rheumatology  Oncology  Dermatology  Cystic Fibrosis          How many days over the past month did your crohns  keep you from your normal activities? For example, brushing your teeth or getting up in the morning. 0    Have you discussed this with your provider? Not needed    Acute Infection Status:    Acute infections noted within Epic:  No active infections  Patient reported infection: None    Therapy Appropriateness:    Is therapy appropriate? Yes, therapy is appropriate and should be continued    DISEASE/MEDICATION-SPECIFIC INFORMATION For patients on injectable medications: Patient currently has 0 doses left.  Next injection is scheduled for 9/9.    PATIENT SPECIFIC NEEDS     - Does the patient have any physical, cognitive, or cultural barriers? No    - Is the patient high risk? No    - Does the patient require a Care Management Plan? No     - Does the patient require physician intervention or other additional services (i.e. nutrition, smoking cessation, social work)? No      SHIPPING     Specialty Medication(s) to be Shipped:   Inflammatory Disorders: Stelara    Other medication(s) to be shipped: No additional medications requested for fill at this time     Changes to insurance: No    Delivery Scheduled: Yes, Expected medication delivery date: 08/11/21.     Medication will be delivered via UPS to the confirmed prescription address in [facility].    The patient will receive a drug information handout for each medication shipped and additional FDA Medication Guides as required.  Verified that patient has previously received a Conservation officer, historic buildings and a Surveyor, mining.    The patient or caregiver noted above participated in the development of this care plan and knows that they can request review of or adjustments to the care plan at any time.      All of the patient's questions and concerns have been addressed.    [name]   [facility] Specialty Pharmacist

## 2021-08-10 NOTE — Unmapped (Signed)
Anne Stout 's Stelara shipment will be delayed as a result of [name] updating the copay card in their system     I have reached out to the patient  at [phone] and communicated the delay. We will reschedule the medication for the delivery date that the patient agreed upon.  We have confirmed the delivery date as 9/7, via ups.

## 2021-08-15 NOTE — Unmapped (Signed)
Anne Stout 's Stelara shipment will be delayed as a result of [name] resolved billing issue.    I have reached out to the patient  at [phone] and communicated the delay. We will call the patient back to reschedule the delivery upon resolution. We have not confirmed the new delivery date.

## 2021-08-18 NOTE — Unmapped (Signed)
Anne Stout called back about the delivery for Stelara and would like the delivery to ship out 08/21/21 via UPS to be delivered 08/22/21 . We have confirmed the delivery via UPS delivery sed .

## 2021-08-21 MED FILL — STELARA 90 MG/ML SUBCUTANEOUS SYRINGE: SUBCUTANEOUS | 56 days supply | Qty: 1 | Fill #1

## 2021-08-29 ENCOUNTER — Ambulatory Visit (INDEPENDENT_AMBULATORY_CARE_PROVIDER_SITE_OTHER): Payer: Medicare PPO | Admitting: Podiatry

## 2021-08-29 ENCOUNTER — Encounter: Payer: Self-pay | Admitting: Podiatry

## 2021-08-29 ENCOUNTER — Other Ambulatory Visit: Payer: Self-pay

## 2021-08-29 DIAGNOSIS — M7671 Peroneal tendinitis, right leg: Secondary | ICD-10-CM

## 2021-08-29 DIAGNOSIS — Z9889 Other specified postprocedural states: Secondary | ICD-10-CM | POA: Diagnosis not present

## 2021-08-29 NOTE — Progress Notes (Signed)
   Subjective:  Patient presents today status post EPF right foot. DOS: 04/20/2021.  Patient states that overall she is doing significantly better.  She does have some mild tenderness in the mornings getting out of bed.  Overall significant improvement however.  She does state that over the last 3 weeks she has been having some tenderness to the lateral aspect of the foot and ankle.  The pain is not severe but more aggravating throughout the day.  She presents for further treatment and evaluation  Past Medical History:  Diagnosis Date   Anemia    Anxiety    Aortic atherosclerosis ([facility]) 03/19/2019   Noted in CT abd   Arthritis    Crohn disease ([place])    GERD (gastroesophageal reflux disease)    High cholesterol    History of kidney stones 04/10/2019   39m calculus in right ureter, noted on CT abd   Hydroureteronephrosis    Moderate, right,noted on CT abd   Meniere disease    Phlebitis    Bilateral lower legs   Seasonal allergies    Trigeminy    Ulcer of abdomen wall (HCC)    stomach ulcers   Ventricular tachycardia ([place] 2009   Objective: Physical Exam General: The patient is alert and oriented x3 in no acute distress.  Dermatology: Skin is cool, dry and supple bilateral lower extremities. Negative for open lesions or macerations.  Vascular: Palpable pedal pulses bilaterally. No edema or erythema noted. Capillary refill within normal limits.  Neurological: Epicritic and protective threshold grossly intact bilaterally.   Musculoskeletal Exam: No pedal deformity noted.  There is some pain on palpation along the peroneal tendon just posterior to the lateral malleolus right foot.  Negative for any significant pain along the plantar heel   Assessment: 1. s/p EPF right. DOS: 04/20/2021 2.  Peroneal tendinitis RT x3 weeks   Plan of Care:  1. Patient was evaluated.  2.  Overall the patient seems to be doing very well 3.  Patient may now resume full activity no restrictions. 4.   Advised against going barefoot and continue to wear good supportive shoes and sneakers 5.  In regard to the peroneal tendinitis of the right foot, the patient declined any oral anti-inflammatory due to the Crohn's/GI upset and she says that the pain is not severe enough to warrant an injection in the area.  We will simply observe for now 6.  Return to clinic as needed  BEdrick Kins DPM [facility]  Dr. BEdrick Kins DPM    [address] [place] [address]               Office [phone] Fax [phone]

## 2021-09-04 DIAGNOSIS — H2513 Age-related nuclear cataract, bilateral: Secondary | ICD-10-CM | POA: Diagnosis not present

## 2021-09-04 DIAGNOSIS — H43813 Vitreous degeneration, bilateral: Secondary | ICD-10-CM | POA: Diagnosis not present

## 2021-09-04 DIAGNOSIS — E538 Deficiency of other specified B group vitamins: Secondary | ICD-10-CM | POA: Diagnosis not present

## 2021-09-04 DIAGNOSIS — H04123 Dry eye syndrome of bilateral lacrimal glands: Secondary | ICD-10-CM | POA: Diagnosis not present

## 2021-09-04 DIAGNOSIS — H35363 Drusen (degenerative) of macula, bilateral: Secondary | ICD-10-CM | POA: Diagnosis not present

## 2021-09-28 ENCOUNTER — Ambulatory Visit: Admit: 2021-09-28 | Discharge: 2021-09-29 | Payer: MEDICARE | Attending: Gastroenterology | Primary: Gastroenterology

## 2021-09-28 DIAGNOSIS — K50919 Crohn's disease, unspecified, with unspecified complications: Principal | ICD-10-CM

## 2021-09-28 DIAGNOSIS — Z6827 Body mass index (BMI) 27.0-27.9, adult: Secondary | ICD-10-CM | POA: Diagnosis not present

## 2021-09-28 NOTE — Unmapped (Signed)
MIRALAX AND GATORADE BOWEL PREP INSTRUCTION FOR ONE TIME CLEANSE    You will need to purchase:    ONE 64oz bottle of Gatorade (any flavor)   ONE 8oz bottle of Miralax (available over-the-counter without prescription)     Mix the 8.3 bottle of Miralax (generic name: Clear Lax) with 64 oz of your Gatorade.  Your Miralax is a powder. It should mix easily in an empty one-gallon container.    Drink one 8-ounce glass every 15-20 minutes until you have finished the 64 ounces. If working properly, you will begin to have diarrhea within 4 hours.  Normal Prep Side Effects Abnormal Prep Side Effects   Normal What to Do Not Normal What to Do     Diarrhea  Bloating  Chills  Cramps  Nausea  Vomiting  Headache  Irritation of rectal area   Drink slower  Take breaks  Drink the mixture cold, or with some ice  Drink through a straw     Skin rash or itching  No bowel movements within    4 hours of finishing your evening prep   Stop taking Miralax  After 4pm, call [facility] at [phone].    The operator will contact the   ???GI Fellow on Call.???    For emergencies, call 911.       Maintenance regimen after bowel cleanse:    After you have completed the bowel cleanse, you can start your regimen of 1-2 CAPFULS of Miralax daily to achieve 1-2 soft bowel movements daily. You may increase or decrease this dosing as needed to maintain the 1-2 soft bowel movement regimen. Please contact us with any questions or concerns.    2. Ciprofloxacin 500 mg BID X 10 days  3. Continue Stelara and famotidine

## 2021-09-28 NOTE — Unmapped (Signed)
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 Second resection was 11/2020.  ??  She is doing really well. Minor complaints of excessive flatulence. She also is recovering from right plantar fascitis and is in a boot today. She has occasional rectal spasms as well.     Her recent scope showed Rutgeerts i2a disease.   ??  ROS  ??  All 10 organ systems reviewed the balance of which was negative.  ??  PE Alert X 3 no acute distress  Abd Non distended  Chest Non labored breathing.  Ext Boot on right foot.  ??  Labs reviewed from PCP reveal mild anemia Hg 10. Rest are wnl.   ??  A/P  ??  Anne Stout is a 65 year old female with CD. We will continue Stelara and we will proceed with a staging colonoscopy. She felt really well after the colonoscopy from a gas perspective. She admits to feeling incomplete evacuation so may benefit from a bowel regimen  ??  1. Miralax clean our and bowel regimen  2. Vitamin D3 1000 international units daily  3. Calcium 1 gram daily  4. Ciprofloxaxin 500 mb BID X 10 days  6. Continue Stelara           Office Visit on 04/27/2021     Office Visit on 04/27/2021

## 2021-10-02 DIAGNOSIS — K50919 Crohn's disease, unspecified, with unspecified complications: Principal | ICD-10-CM

## 2021-10-02 MED ORDER — CIPROFLOXACIN 500 MG TABLET
ORAL_TABLET | Freq: Two times a day (BID) | ORAL | 0 refills | 10 days | Status: CP
Start: 2021-10-02 — End: 2021-10-12

## 2021-10-04 DIAGNOSIS — E538 Deficiency of other specified B group vitamins: Secondary | ICD-10-CM | POA: Diagnosis not present

## 2021-10-04 NOTE — Unmapped (Signed)
[facility] Refill Coordination Note    Specialty Medication(s) to be Shipped:   Inflammatory Disorders: Stelara    Other medication(s) to be shipped: No additional medications requested for fill at this time     Anne Stout, DOB: [birth year]  Phone: [phone] (home)       All above HIPAA information was verified with patient.     Was a Nurse, learning disability used for this call? No    Completed refill call assessment today to schedule patient's medication shipment from the [facility] [phone]).  All relevant notes have been reviewed.     Specialty medication(s) and dose(s) confirmed: Regimen is correct and unchanged.   Changes to medications: Anne Stout reports starting the following medications: ciprofloxacin HCl 500 MG tablet (CIPRO) - 1 Tablet bid for 10 days.  Changes to insurance: No  New side effects reported not previously addressed with a pharmacist or physician: None reported  Questions for the pharmacist: No    Confirmed patient received a Conservation officer, historic buildings and a Surveyor, mining with first shipment. The patient will receive a drug information handout for each medication shipped and additional FDA Medication Guides as required.       DISEASE/MEDICATION-SPECIFIC INFORMATION        For patients on injectable medications: Patient currently has 0 doses left.  Next injection is scheduled for 10/13/21.    SPECIALTY MEDICATION ADHERENCE     Medication Adherence    Patient reported X missed doses in the last month: 0  Specialty Medication: STELARA 90 mg/mL  Patient is on additional specialty medications: No  Patient is on more than two specialty medications: No  Any gaps in refill history greater than 2 weeks in the last 3 months: no  Demonstrates understanding of importance of adherence: yes  Informant: patient  Reliability of informant: reliable  Confirmed plan for next specialty medication refill: delivery by pharmacy  Refills needed for supportive medications: not needed              Were doses missed due to medication being on hold? No    STELARA 90 mg/mL: 0 days of medicine on hand       REFERRAL TO PHARMACIST     Referral to the pharmacist: Not needed      [facility]     Shipping address confirmed in Epic.     Delivery Scheduled: Yes, Expected medication delivery date: 10/11/21.     Medication will be delivered via UPS to the prescription address in Epic WAM.    Anne Stout   [facility] Specialty Technician

## 2021-10-10 MED FILL — STELARA 90 MG/ML SUBCUTANEOUS SYRINGE: SUBCUTANEOUS | 56 days supply | Qty: 1 | Fill #2

## 2021-10-13 DIAGNOSIS — K5090 Crohn's disease, unspecified, without complications: Secondary | ICD-10-CM | POA: Diagnosis not present

## 2021-10-13 DIAGNOSIS — I10 Essential (primary) hypertension: Secondary | ICD-10-CM | POA: Diagnosis not present

## 2021-10-13 DIAGNOSIS — Z23 Encounter for immunization: Secondary | ICD-10-CM | POA: Diagnosis not present

## 2021-10-13 DIAGNOSIS — E876 Hypokalemia: Secondary | ICD-10-CM | POA: Diagnosis not present

## 2021-10-13 DIAGNOSIS — I700 Atherosclerosis of aorta: Secondary | ICD-10-CM | POA: Diagnosis not present

## 2021-10-13 DIAGNOSIS — E782 Mixed hyperlipidemia: Secondary | ICD-10-CM | POA: Diagnosis not present

## 2021-10-13 DIAGNOSIS — E538 Deficiency of other specified B group vitamins: Secondary | ICD-10-CM | POA: Diagnosis not present

## 2021-10-13 DIAGNOSIS — J432 Centrilobular emphysema: Secondary | ICD-10-CM | POA: Diagnosis not present

## 2021-11-06 DIAGNOSIS — E538 Deficiency of other specified B group vitamins: Secondary | ICD-10-CM | POA: Diagnosis not present

## 2021-11-14 DIAGNOSIS — K50919 Crohn's disease, unspecified, with unspecified complications: Principal | ICD-10-CM

## 2021-11-20 NOTE — Progress Notes (Signed)
[facility]  [address] [place],  [place]  [address] [phone]  Clinic Day:  11/29/2021  Referring physician: Gaynelle Arabian, MD  This document serves as a record of services personally performed by Dequincy Macarthur Critchley, MD. It was created on their behalf by [facility] E, a trained medical scribe. The creation of this record is based on the scribe's personal observations and the provider's statements to them.  HISTORY OF PRESENT ILLNESS:  The patient is a 65 y.o. female who I recently began seeing for iron deficiency anemia.  Due to her having an allergic reaction to IV iron, the patient has been taking oral iron daily to ensure an adequate response without iron fluids.  Of note, she also has a history of Crohn's disease, which is led to multiple previous bowel surgeries.  She comes in today to reassess her iron and hemoglobin levels.  Since her last visit, the patient has been doing fairly well.  There are still occasional episodes of intermittent rectal bleeding, which she attributes to her Crohn's disease.  However, she denies having any severe episodes recently.  PHYSICAL EXAM:  Blood pressure (!) 142/75, pulse 66, temperature 97.9 F (36.6 C), resp. rate 16, height 5' 3"  (1.6 m), weight 154 lb 6.4 oz (70 kg), SpO2 97 %. Wt Readings from Last 3 Encounters:  11/29/21 154 lb 6.4 oz (70 kg)  07/27/21 147 lb 11.2 oz (67 kg)  07/21/21 145 lb (65.8 kg)   Body mass index is 27.35 kg/m. Performance status (ECOG): 0 - Asymptomatic Physical Exam Constitutional:      Appearance: Normal appearance. She is not ill-appearing.  HENT:     Mouth/Throat:     Mouth: Mucous membranes are moist.     Pharynx: Oropharynx is clear. No oropharyngeal exudate or posterior oropharyngeal erythema.  Cardiovascular:     Rate and Rhythm: Normal rate and regular rhythm.     Heart sounds: No murmur heard.   No friction rub. No gallop.  Pulmonary:     Effort: Pulmonary  effort is normal. No respiratory distress.     Breath sounds: Normal breath sounds. No wheezing, rhonchi or rales.  Abdominal:     General: Bowel sounds are normal. There is no distension.     Palpations: Abdomen is soft. There is no mass.     Tenderness: There is no abdominal tenderness.  Musculoskeletal:        General: No swelling.     Right lower leg: No edema.     Left lower leg: No edema.  Lymphadenopathy:     Cervical: No cervical adenopathy.     Upper Body:     Right upper body: No supraclavicular or axillary adenopathy.     Left upper body: No supraclavicular or axillary adenopathy.     Lower Body: No right inguinal adenopathy. No left inguinal adenopathy.  Skin:    General: Skin is warm.     Coloration: Skin is not jaundiced.     Findings: No lesion or rash.  Neurological:     General: No focal deficit present.     Mental Status: She is alert and oriented to person, place, and time. Mental status is at baseline.  Psychiatric:        Mood and Affect: Mood normal.        Behavior: Behavior normal.        Thought Content: Thought content normal.   LABS:    Latest Reference Range & Units  11/29/21 00:00  WBC  5.6  RBC 3.87 - 5.11  3.77 !  Hemoglobin 12.0 - 16.0  11.6 !  MCV  94  HCT 36 - 46  36  Platelets 150 - 399  216  NEUT#  3.08    Latest Reference Range & Units 07/27/21 13:40 11/29/21 10:08  Iron 28 - 170 ug/dL 44 77  UIBC ug/dL 463 387  TIBC 250 - 450 ug/dL 507 (H) 464 (H)  Saturation Ratios 10.4 - 31.8 % 9 (L) 17  Ferritin 11 - 307 ng/mL 24 22  (H): Data is abnormally high (L): Data is abnormally low ASSESSMENT & PLAN:  A 65 y.o. female with iron deficiency anemia.  I am pleased as her iron pills are keeping her iron and hemoglobin at satisfactory levels.  Based upon this, she knows to continue taking her oral iron on a daily basis.  Clinically, she appears to be doing well.  As of the case, I will see her back in 6 months for repeat clinical assessment.   The patient understands all the plans discussed today and is in agreement with them.  I, Rita Ohara, am acting as scribe for Marice Potter, MD    I have reviewed this report as typed by the medical scribe, and it is complete and accurate.  Dequincy Macarthur Critchley, MD

## 2021-11-28 NOTE — Unmapped (Signed)
[facility] Clinical Assessment & Refill Coordination Note    Anne Stout, [place]: 14-Nov-1956  Phone: [phone] (home)     All above HIPAA information was verified with patient.     Was a Nurse, learning disability used for this call? No    Specialty Medication(s):   Inflammatory Disorders: Stelara     Current Outpatient Medications   Medication Sig Dispense Refill   ??? acetaminophen (TYLENOL 8 HOUR ORAL) Take 2 tablets by mouth every eight (8) hours.     ??? ALPRAZolam (XANAX) 0.5 MG tablet Take 0.5 tablets by mouth daily as needed.     ??? cyanocobalamin, vitamin B-12, (B-12 COMPLIANCE) 1,000 mcg/mL Kit every thirty (30) days.     ??? empty container Misc USE AS DIRECTED 1 each 2   ??? famotidine (PEPCID) 40 MG tablet Take 40 mg by mouth Two (2) times a day.     ??? ferrous sulfate 325 (65 FE) MG tablet Take 650 mg by mouth two (2) times a day.     ??? loratadine (CLARITIN REDITABS) 10 mg dissolvable tablet Take 10 mg by mouth daily.     ??? potassium chloride (KLOR-CON) 20 MEQ CR tablet Take 20 mEq by mouth daily.     ??? rosuvastatin (CRESTOR) 5 MG tablet Take 5 mg by mouth daily.     ??? simethicone (MYLICON) 125 MG chewable tablet Take 2 tablets (250 mg) at 5:00 pm the day before your procedure. Take remaining 2 tablets (250 mg) at least 4 hours before your procedure time. (Patient not taking: Reported on 09/28/2021) 4 tablet 0   ??? ustekinumab (STELARA) 45 mg/0.5 mL Syrg syringe Inject 90 mg under the skin every 8 weeks.  (Patient not taking: No sig reported)     ??? ustekinumab (STELARA) 90 mg/mL Syrg syringe Inject the contents of 1 syringe (90 mg total) under the skin every 8 weeks. 1 mL 6     No current facility-administered medications for this visit.        Changes to medications: Staceyann reports no changes at this time.    Allergies   Allergen Reactions   ??? Cephalexin Hives, Itching, Nausea Only and Rash   ??? Infliximab Shortness Of Breath, Anxiety and Palpitations     (Remicade)   ??? Venofer [Iron Sucrose]      Feet/legs/hand/face swelling, nausea, headaches,    ??? Esomeprazole Magnesium      Drops magnesium and potassium   ??? Zolpidem Confusion     Other reaction(s): Other (See Comments)  Sleep walking     ??? Clindamycin Nausea Only and Other (See Comments)     Severe Nausea / diarrhea   ??? Codeine Nausea Only   ??? Trazodone Palpitations       Changes to allergies: No    SPECIALTY MEDICATION ADHERENCE     Stelara 90 mg/ml: 0 days of medicine on hand     Medication Adherence    Patient reported X missed doses in the last month: 0  Specialty Medication: Stelara 90 mg/mL - Q8wks  Patient is on additional specialty medications: No  Informant: patient          Specialty medication(s) dose(s) confirmed: Regimen is correct and unchanged.     Are there any concerns with adherence? No    Adherence counseling provided? Not needed    CLINICAL MANAGEMENT AND INTERVENTION      Clinical Benefit Assessment:    Do you feel the medicine is effective or  helping your condition? Yes    Clinical Benefit counseling provided? Progress note from 10/20 shows evidence of clinical benefit    Adverse Effects Assessment:    Are you experiencing any side effects? No    Are you experiencing difficulty administering your medicine? No    Quality of Life Assessment:    Quality of Life    Rheumatology  Oncology  Dermatology  Cystic Fibrosis          How many days over the past month did your crohn's disease  keep you from your normal activities? For example, brushing your teeth or getting up in the morning. 0    Have you discussed this with your provider? Not needed    Acute Infection Status:    Acute infections noted within Epic:  No active infections  Patient reported infection: None - having some congestion which she attributes to seasonal allergies    Therapy Appropriateness:    Is therapy appropriate and patient progressing towards therapeutic goals? Yes, therapy is appropriate and should be continued    DISEASE/MEDICATION-SPECIFIC INFORMATION      For patients on injectable medications: Patient currently has 0 doses left.  Next injection is scheduled for 12/15/21 (pt stated last dose was given a week late due to her receiving the shingles and flu vaccines on the date her Stelara injection was originally due).    PATIENT SPECIFIC NEEDS     - Does the patient have any physical, cognitive, or cultural barriers? No    - Is the patient high risk? No    - Does the patient require a Care Management Plan? No     SOCIAL DETERMINANTS OF HEALTH     At the [facility], we have learned that life circumstances - like trouble affording food, housing, utilities, or transportation can affect the health of many of our patients.   That is why we wanted to ask: are you currently experiencing any life circumstances that are negatively impacting your health and/or quality of life? Patient declined to answer    Social Determinants of Health     Food Insecurity: Not on file   Tobacco Use: Medium Risk   ??? Smoking Tobacco Use: Former   ??? Smokeless Tobacco Use: Never   ??? Passive Exposure: Not on file   Transportation Needs: Not on file   Alcohol Use: Not on file   Housing/Utilities: Not on file   Substance Use: Not on file   Financial Resource Strain: Not on file   Physical Activity: Not on file   Health Literacy: Not on file   Stress: Not on file   Intimate Partner Violence: Not on file   Depression: Not on file   Social Connections: Not on file       Would you be willing to receive help with any of the needs that you have identified today? Not applicable       SHIPPING     Specialty Medication(s) to be Shipped:   Inflammatory Disorders: Stelara    Other medication(s) to be shipped: No additional medications requested for fill at this time     Changes to insurance: No    Delivery Scheduled: Yes, Expected medication delivery date: 12/07/21.     Medication will be delivered via UPS to the confirmed prescription address in [facility].    The patient will receive a drug information handout for each medication shipped and additional FDA Medication Guides as required.  Verified that patient has previously received  a Conservation officer, historic buildings and a Surveyor, mining.    The patient or caregiver noted above participated in the development of this care plan and knows that they can request review of or adjustments to the care plan at any time.      All of the patient's questions and concerns have been addressed.    [name]   [facility] Specialty Pharmacist

## 2021-11-29 ENCOUNTER — Inpatient Hospital Stay: Payer: Medicare PPO | Admitting: Oncology

## 2021-11-29 ENCOUNTER — Inpatient Hospital Stay: Payer: Medicare PPO | Attending: Oncology

## 2021-11-29 ENCOUNTER — Telehealth: Payer: Self-pay | Admitting: Oncology

## 2021-11-29 ENCOUNTER — Encounter: Payer: Self-pay | Admitting: Oncology

## 2021-11-29 ENCOUNTER — Other Ambulatory Visit: Payer: Self-pay | Admitting: Oncology

## 2021-11-29 VITALS — BP 142/75 | HR 66 | Temp 97.9°F | Resp 16 | Ht 63.0 in | Wt 154.4 lb

## 2021-11-29 DIAGNOSIS — D500 Iron deficiency anemia secondary to blood loss (chronic): Secondary | ICD-10-CM | POA: Diagnosis not present

## 2021-11-29 DIAGNOSIS — D508 Other iron deficiency anemias: Secondary | ICD-10-CM

## 2021-11-29 DIAGNOSIS — D509 Iron deficiency anemia, unspecified: Secondary | ICD-10-CM | POA: Insufficient documentation

## 2021-11-29 LAB — IRON AND TIBC
Iron: 77 ug/dL (ref 28–170)
Saturation Ratios: 17 % (ref 10.4–31.8)
TIBC: 464 ug/dL — ABNORMAL HIGH (ref 250–450)
UIBC: 387 ug/dL

## 2021-11-29 LAB — CBC AND DIFFERENTIAL
HCT: 36 (ref 36–46)
Hemoglobin: 11.6 — AB (ref 12.0–16.0)
Neutrophils Absolute: 3.08
Platelets: 216 (ref 150–399)
WBC: 5.6

## 2021-11-29 LAB — FERRITIN: Ferritin: 22 ng/mL (ref 11–307)

## 2021-11-29 LAB — CBC: RBC: 3.77 — AB (ref 3.87–5.11)

## 2021-11-29 NOTE — Telephone Encounter (Signed)
Per 12/21 los next appt scheduled and given to patient

## 2021-12-06 DIAGNOSIS — E538 Deficiency of other specified B group vitamins: Secondary | ICD-10-CM | POA: Diagnosis not present

## 2021-12-06 MED FILL — STELARA 90 MG/ML SUBCUTANEOUS SYRINGE: SUBCUTANEOUS | 56 days supply | Qty: 1 | Fill #3

## 2021-12-14 DIAGNOSIS — L03031 Cellulitis of right toe: Secondary | ICD-10-CM | POA: Diagnosis not present

## 2021-12-19 NOTE — Unmapped (Signed)
Specialty Medication(s): Stelara    Anne Stout has been dis-enrolled from the [facility] specialty pharmacy services due to a pharmacy change. The patient is now filling at [facility] since they no longer qualify for manufacturer assistance beginning Jan 1st, 2023.    Additional information provided to the patient: n/a    [name]  [facility] Specialty Pharmacist

## 2022-01-08 DIAGNOSIS — L03031 Cellulitis of right toe: Secondary | ICD-10-CM | POA: Diagnosis not present

## 2022-01-08 DIAGNOSIS — E538 Deficiency of other specified B group vitamins: Secondary | ICD-10-CM | POA: Diagnosis not present

## 2022-01-18 DIAGNOSIS — U071 COVID-19: Secondary | ICD-10-CM | POA: Diagnosis not present

## 2022-01-18 DIAGNOSIS — H9201 Otalgia, right ear: Secondary | ICD-10-CM | POA: Diagnosis not present

## 2022-01-19 ENCOUNTER — Ambulatory Visit: Payer: Medicare PPO | Admitting: Podiatry

## 2022-02-08 DIAGNOSIS — E538 Deficiency of other specified B group vitamins: Secondary | ICD-10-CM | POA: Diagnosis not present

## 2022-02-19 DIAGNOSIS — N760 Acute vaginitis: Secondary | ICD-10-CM | POA: Diagnosis not present

## 2022-02-20 ENCOUNTER — Encounter: Payer: Self-pay | Admitting: Podiatry

## 2022-02-20 ENCOUNTER — Ambulatory Visit: Payer: Medicare PPO | Admitting: Podiatry

## 2022-02-20 ENCOUNTER — Other Ambulatory Visit: Payer: Self-pay

## 2022-02-20 DIAGNOSIS — L600 Ingrowing nail: Secondary | ICD-10-CM

## 2022-02-20 DIAGNOSIS — R52 Pain, unspecified: Secondary | ICD-10-CM | POA: Diagnosis not present

## 2022-02-20 NOTE — Progress Notes (Signed)
? ?  Subjective: ?Patient presents today for evaluation of pain to the lateral border right great toe. Patient is concerned for possible ingrown nail.  It is very sensitive to touch.  Patient presents today for further treatment and evaluation. ? ?Past Medical History:  ?Diagnosis Date  ? Anemia   ? Anxiety   ? Aortic atherosclerosis ([place]) 03/19/2019  ? Noted in CT abd  ? Arthritis   ? Crohn disease ([place])   ? GERD (gastroesophageal reflux disease)   ? High cholesterol   ? History of kidney stones 04/10/2019  ? 9m calculus in right ureter, noted on CT abd  ? Hydroureteronephrosis   ? Moderate, right,noted on CT abd  ? Meniere disease   ? Phlebitis   ? Bilateral lower legs  ? Seasonal allergies   ? Trigeminy   ? Ulcer of abdomen wall ([place]   ? stomach ulcers  ? Ventricular tachycardia 2009  ? ? ?Objective:  ?General: Well developed, nourished, in no acute distress, alert and oriented x3  ? ?Dermatology: Skin is warm, dry and supple bilateral.  Lateral border right great toe appears to be erythematous with evidence of an ingrowing nail. Pain on palpation noted to the border of the nail fold. The remaining nails appear unremarkable at this time. There are no open sores, lesions. ? ?Vascular: Dorsalis Pedis artery and Posterior Tibial artery pedal pulses palpable. No lower extremity edema noted.  ? ?Neruologic: Grossly intact via light touch bilateral. ? ?Musculoskeletal: Muscular strength within normal limits in all groups bilateral. Normal range of motion noted to all pedal and ankle joints.  ? ?Assesement: ?#1 Paronychia with ingrowing nail lateral border right great toe ?#2 Pain in toe ? ?Plan of Care:  ?1. Patient evaluated.  ?2. Discussed treatment alternatives and plan of care. Explained nail avulsion procedure and post procedure course to patient. ?3. Patient opted for permanent partial nail avulsion of the ingrown portion of the nail.  ?4. Prior to procedure, local anesthesia infiltration utilized using 3 ml of  a 50:50 mixture of 2% plain lidocaine and 0.5% plain marcaine in a normal hallux block fashion and a betadine prep performed.  ?5. Partial permanent nail avulsion with chemical matrixectomy performed using [id number] of phenol followed by alcohol flush.  ?6. Light dressing applied.  Post care instructions provided ?7.  Patient has mupirocin at home.  Apply daily ?8.  Return to clinic 2 weeks. ? ?BEdrick Kins DPM ?[place]? ?Dr. BEdrick Kins DPM  ?  ?[address]                                       ?[place] [id number]               ?Office ([phone] ?Fax ([phone]? ? ? ? ?

## 2022-02-28 DIAGNOSIS — L570 Actinic keratosis: Secondary | ICD-10-CM | POA: Diagnosis not present

## 2022-02-28 DIAGNOSIS — D225 Melanocytic nevi of trunk: Secondary | ICD-10-CM | POA: Diagnosis not present

## 2022-02-28 DIAGNOSIS — L298 Other pruritus: Secondary | ICD-10-CM | POA: Diagnosis not present

## 2022-02-28 DIAGNOSIS — L814 Other melanin hyperpigmentation: Secondary | ICD-10-CM | POA: Diagnosis not present

## 2022-02-28 DIAGNOSIS — L821 Other seborrheic keratosis: Secondary | ICD-10-CM | POA: Diagnosis not present

## 2022-02-28 DIAGNOSIS — L820 Inflamed seborrheic keratosis: Secondary | ICD-10-CM | POA: Diagnosis not present

## 2022-02-28 DIAGNOSIS — L538 Other specified erythematous conditions: Secondary | ICD-10-CM | POA: Diagnosis not present

## 2022-03-06 ENCOUNTER — Encounter: Payer: Self-pay | Admitting: Podiatry

## 2022-03-06 ENCOUNTER — Ambulatory Visit: Payer: Medicare PPO | Admitting: Podiatry

## 2022-03-06 ENCOUNTER — Other Ambulatory Visit: Payer: Self-pay

## 2022-03-06 DIAGNOSIS — L600 Ingrowing nail: Secondary | ICD-10-CM | POA: Diagnosis not present

## 2022-03-06 NOTE — Progress Notes (Signed)
? ?  Subjective: ?66 y.o. female presents today status post permanent nail avulsion procedure of the lateral border right great toe that was performed on 02/20/2022.  ? ?Past Medical History:  ?Diagnosis Date  ? Anemia   ? Anxiety   ? Aortic atherosclerosis ([place]) 03/19/2019  ? Noted in CT abd  ? Arthritis   ? Crohn disease ([place])   ? GERD (gastroesophageal reflux disease)   ? High cholesterol   ? History of kidney stones 04/10/2019  ? 53m calculus in right ureter, noted on CT abd  ? Hydroureteronephrosis   ? Moderate, right,noted on CT abd  ? Meniere disease   ? Phlebitis   ? Bilateral lower legs  ? Seasonal allergies   ? Trigeminy   ? Ulcer of abdomen wall ([place]   ? stomach ulcers  ? Ventricular tachycardia 2009  ? ? ?Objective: ?Skin is warm, dry and supple. Nail and respective nail fold appears to be healing appropriately. Open wound to the associated nail fold with a granular wound base and moderate amount of fibrotic tissue. Minimal drainage noted. Mild erythema around the periungual region likely due to phenol chemical matricectomy. ? ?Assessment: ?#1 s/p partial permanent nail matrixectomy lateral border right great toe ? ? ?Plan of care: ?#1 patient was evaluated  ?#2 light debridement of open wound was performed to the periungual border of the respective toe using a currette. Antibiotic ointment and Band-Aid was applied. ?#3 patient is to return to clinic on a PRN basis. ? ? ?BEdrick Kins DPM ?[place]? ?Dr. BEdrick Kins DPM  ?  ?[address]                                      ?[place] [address]               ?Office [phone] ?Fax ([phone]? ? ? ? ?

## 2022-03-12 DIAGNOSIS — E538 Deficiency of other specified B group vitamins: Secondary | ICD-10-CM | POA: Diagnosis not present

## 2022-03-15 ENCOUNTER — Ambulatory Visit: Admit: 2022-03-15 | Discharge: 2022-03-16 | Payer: MEDICARE | Attending: Gastroenterology | Primary: Gastroenterology

## 2022-03-15 DIAGNOSIS — K50919 Crohn's disease, unspecified, with unspecified complications: Principal | ICD-10-CM

## 2022-03-15 DIAGNOSIS — Z6827 Body mass index (BMI) 27.0-27.9, adult: Secondary | ICD-10-CM | POA: Diagnosis not present

## 2022-04-11 DIAGNOSIS — E538 Deficiency of other specified B group vitamins: Secondary | ICD-10-CM | POA: Diagnosis not present

## 2022-04-20 ENCOUNTER — Other Ambulatory Visit: Payer: Self-pay | Admitting: Family Medicine

## 2022-04-20 DIAGNOSIS — E782 Mixed hyperlipidemia: Secondary | ICD-10-CM | POA: Diagnosis not present

## 2022-04-20 DIAGNOSIS — E876 Hypokalemia: Secondary | ICD-10-CM | POA: Diagnosis not present

## 2022-04-20 DIAGNOSIS — D509 Iron deficiency anemia, unspecified: Secondary | ICD-10-CM | POA: Diagnosis not present

## 2022-04-20 DIAGNOSIS — Z23 Encounter for immunization: Secondary | ICD-10-CM | POA: Diagnosis not present

## 2022-04-20 DIAGNOSIS — J432 Centrilobular emphysema: Secondary | ICD-10-CM | POA: Diagnosis not present

## 2022-04-20 DIAGNOSIS — Z13820 Encounter for screening for osteoporosis: Secondary | ICD-10-CM

## 2022-04-20 DIAGNOSIS — E538 Deficiency of other specified B group vitamins: Secondary | ICD-10-CM | POA: Diagnosis not present

## 2022-04-20 DIAGNOSIS — K5090 Crohn's disease, unspecified, without complications: Secondary | ICD-10-CM | POA: Diagnosis not present

## 2022-04-20 DIAGNOSIS — I700 Atherosclerosis of aorta: Secondary | ICD-10-CM | POA: Diagnosis not present

## 2022-04-20 DIAGNOSIS — I10 Essential (primary) hypertension: Secondary | ICD-10-CM | POA: Diagnosis not present

## 2022-04-20 DIAGNOSIS — Z0000 Encounter for general adult medical examination without abnormal findings: Secondary | ICD-10-CM | POA: Diagnosis not present

## 2022-05-15 DIAGNOSIS — E538 Deficiency of other specified B group vitamins: Secondary | ICD-10-CM | POA: Diagnosis not present

## 2022-05-29 NOTE — Progress Notes (Unsigned)
[place]  [address] [place],  [place]  [address] [phone]  Clinic Day:  05/30/2022  Referring physician: Gaynelle Arabian, MD  HISTORY OF PRESENT ILLNESS:  The patient is a 66 y.o. female who I recently began seeing for iron deficiency anemia.  Due to her having an allergic reaction to IV iron, the patient has been taking oral iron daily to ensure an adequate fortification of her iron stores.  She currently takes 2 iron pills on a daily basis.  Of note, she has Crohn's disease, which has led to multiple previous bowel surgeries.  Fortunately, the patient has not had a flareup of her Crohn's disease since December 2021.  She comes in today to reassess her iron and hemoglobin levels.  Since her last visit, the patient has been doing fairly well.  She denies having any overt forms of blood loss since her last visit which inform her for progressive iron deficiency anemia.  PHYSICAL EXAM:  Blood pressure (!) 126/59, pulse 68, temperature 97.7 F (36.5 C), resp. rate 14, height 5' 3"  (1.6 m), weight 155 lb 12.8 oz (70.7 kg), SpO2 99 %. Wt Readings from Last 3 Encounters:  05/30/22 155 lb 12.8 oz (70.7 kg)  11/29/21 154 lb 6.4 oz (70 kg)  07/27/21 147 lb 11.2 oz (67 kg)   Body mass index is 27.6 kg/m. Performance status (ECOG): 0 - Asymptomatic Physical Exam Constitutional:      Appearance: Normal appearance. She is not ill-appearing.  HENT:     Mouth/Throat:     Mouth: Mucous membranes are moist.     Pharynx: Oropharynx is clear. No oropharyngeal exudate or posterior oropharyngeal erythema.  Cardiovascular:     Rate and Rhythm: Normal rate and regular rhythm.     Heart sounds: No murmur heard.    No friction rub. No gallop.  Pulmonary:     Effort: Pulmonary effort is normal. No respiratory distress.     Breath sounds: Normal breath sounds. No wheezing, rhonchi or rales.  Abdominal:     General: Bowel sounds are normal. There is no  distension.     Palpations: Abdomen is soft. There is no mass.     Tenderness: There is no abdominal tenderness.  Musculoskeletal:        General: No swelling.     Right lower leg: No edema.     Left lower leg: No edema.  Lymphadenopathy:     Cervical: No cervical adenopathy.     Upper Body:     Right upper body: No supraclavicular or axillary adenopathy.     Left upper body: No supraclavicular or axillary adenopathy.     Lower Body: No right inguinal adenopathy. No left inguinal adenopathy.  Skin:    General: Skin is warm.     Coloration: Skin is not jaundiced.     Findings: No lesion or rash.  Neurological:     General: No focal deficit present.     Mental Status: She is alert and oriented to person, place, and time. Mental status is at baseline.  Psychiatric:        Mood and Affect: Mood normal.        Behavior: Behavior normal.        Thought Content: Thought content normal.    LABS:     Latest Reference Range & Units Most Recent  Iron 28 - 170 ug/dL 52 05/30/22 09:58  UIBC ug/dL 405 05/30/22 09:58  TIBC 250 - 450  ug/dL 457 (H) 05/30/22 09:58  Saturation Ratios 10.4 - 31.8 % 11 05/30/22 09:58  Ferritin 11 - 307 ng/mL 37 05/30/22 09:58  (H): Data is abnormally high  ASSESSMENT & PLAN:  A 66 y.o. female with iron deficiency anemia.  I am pleased as her iron pills are keeping her iron parameters at suitable levels.  Her hemoglobin is even better today than what it was previously.  Based upon this, she knows to continue taking her oral iron on a daily basis.  Clinically, she appears to be doing well.  I will see her back in 6 months for repeat clinical assessment.  The patient understands all the plans discussed today and is in agreement with them.  Delorean Knutzen Macarthur Critchley, MD

## 2022-05-30 ENCOUNTER — Other Ambulatory Visit: Payer: Self-pay | Admitting: Oncology

## 2022-05-30 ENCOUNTER — Other Ambulatory Visit: Payer: Self-pay

## 2022-05-30 ENCOUNTER — Inpatient Hospital Stay: Payer: Medicare PPO | Attending: Oncology | Admitting: Oncology

## 2022-05-30 ENCOUNTER — Inpatient Hospital Stay: Payer: Medicare PPO

## 2022-05-30 VITALS — BP 126/59 | HR 68 | Temp 97.7°F | Resp 14 | Ht 63.0 in | Wt 155.8 lb

## 2022-05-30 DIAGNOSIS — D508 Other iron deficiency anemias: Secondary | ICD-10-CM

## 2022-05-30 DIAGNOSIS — D509 Iron deficiency anemia, unspecified: Secondary | ICD-10-CM | POA: Diagnosis not present

## 2022-05-30 DIAGNOSIS — D649 Anemia, unspecified: Secondary | ICD-10-CM | POA: Diagnosis not present

## 2022-05-30 DIAGNOSIS — D500 Iron deficiency anemia secondary to blood loss (chronic): Secondary | ICD-10-CM

## 2022-05-30 LAB — CBC AND DIFFERENTIAL
HCT: 37 (ref 36–46)
Hemoglobin: 12.3 (ref 12.0–16.0)
Neutrophils Absolute: 2.92
Platelets: 218 10*3/uL (ref 150–400)
WBC: 5.4

## 2022-05-30 LAB — CBC: RBC: 3.82 — AB (ref 3.87–5.11)

## 2022-05-30 LAB — FERRITIN: Ferritin: 37 ng/mL (ref 11–307)

## 2022-05-30 LAB — IRON AND TIBC
Iron: 52 ug/dL (ref 28–170)
Saturation Ratios: 11 % (ref 10.4–31.8)
TIBC: 457 ug/dL — ABNORMAL HIGH (ref 250–450)
UIBC: 405 ug/dL

## 2022-06-15 DIAGNOSIS — E538 Deficiency of other specified B group vitamins: Secondary | ICD-10-CM | POA: Diagnosis not present

## 2022-07-16 DIAGNOSIS — E538 Deficiency of other specified B group vitamins: Secondary | ICD-10-CM | POA: Diagnosis not present

## 2022-07-23 ENCOUNTER — Ambulatory Visit
Admission: RE | Admit: 2022-07-23 | Discharge: 2022-07-23 | Disposition: A | Discharge status: Home / Self Care | Payer: Medicare PPO | Source: Ambulatory Visit | Attending: Acute Care | Admitting: Acute Care

## 2022-07-23 DIAGNOSIS — Z87891 Personal history of nicotine dependence: Secondary | ICD-10-CM | POA: Insufficient documentation

## 2022-07-25 ENCOUNTER — Other Ambulatory Visit: Payer: Self-pay

## 2022-07-25 DIAGNOSIS — Z87891 Personal history of nicotine dependence: Secondary | ICD-10-CM

## 2022-07-25 DIAGNOSIS — Z122 Encounter for screening for malignant neoplasm of respiratory organs: Secondary | ICD-10-CM

## 2022-08-15 ENCOUNTER — Emergency Department: Payer: Medicare PPO

## 2022-08-15 ENCOUNTER — Other Ambulatory Visit: Payer: Self-pay | Admitting: Family Medicine

## 2022-08-15 ENCOUNTER — Encounter: Payer: Self-pay | Admitting: Emergency Medicine

## 2022-08-15 DIAGNOSIS — N23 Unspecified renal colic: Secondary | ICD-10-CM | POA: Diagnosis not present

## 2022-08-15 DIAGNOSIS — K820 Obstruction of gallbladder: Secondary | ICD-10-CM | POA: Diagnosis not present

## 2022-08-15 DIAGNOSIS — N132 Hydronephrosis with renal and ureteral calculous obstruction: Secondary | ICD-10-CM | POA: Diagnosis not present

## 2022-08-15 DIAGNOSIS — R1032 Left lower quadrant pain: Secondary | ICD-10-CM | POA: Diagnosis present

## 2022-08-15 DIAGNOSIS — M4316 Spondylolisthesis, lumbar region: Secondary | ICD-10-CM | POA: Diagnosis not present

## 2022-08-15 DIAGNOSIS — R319 Hematuria, unspecified: Secondary | ICD-10-CM | POA: Diagnosis not present

## 2022-08-15 DIAGNOSIS — N201 Calculus of ureter: Secondary | ICD-10-CM | POA: Diagnosis not present

## 2022-08-15 DIAGNOSIS — I10 Essential (primary) hypertension: Secondary | ICD-10-CM | POA: Diagnosis not present

## 2022-08-15 LAB — CBC WITH DIFFERENTIAL/PLATELET
Abs Immature Granulocytes: 0.02 10*3/uL (ref 0.00–0.07)
Basophils Absolute: 0 10*3/uL (ref 0.0–0.1)
Basophils Relative: 0 %
Eosinophils Absolute: 0.2 10*3/uL (ref 0.0–0.5)
Eosinophils Relative: 2 %
HCT: 38.5 % (ref 36.0–46.0)
Hemoglobin: 12.1 g/dL (ref 12.0–15.0)
Immature Granulocytes: 0 %
Lymphocytes Relative: 41 %
Lymphs Abs: 2.8 10*3/uL (ref 0.7–4.0)
MCH: 31.3 pg (ref 26.0–34.0)
MCHC: 31.4 g/dL (ref 30.0–36.0)
MCV: 99.5 fL (ref 80.0–100.0)
Monocytes Absolute: 0.5 10*3/uL (ref 0.1–1.0)
Monocytes Relative: 7 %
Neutro Abs: 3.4 10*3/uL (ref 1.7–7.7)
Neutrophils Relative %: 50 %
Platelets: 153 10*3/uL (ref 150–400)
RBC: 3.87 MIL/uL (ref 3.87–5.11)
RDW: 13.3 % (ref 11.5–15.5)
WBC: 6.9 10*3/uL (ref 4.0–10.5)
nRBC: 0 % (ref 0.0–0.2)

## 2022-08-15 NOTE — ED Triage Notes (Signed)
Pt reports hematuria and left sided flank pain x3 days. Was seen at PCP today and scheduled for CT tomorrow but pain intolerable per pt. Hx/o kidney stones

## 2022-08-16 ENCOUNTER — Emergency Department
Admission: EM | Admit: 2022-08-16 | Discharge: 2022-08-16 | Disposition: A | Discharge status: Home / Self Care | Payer: Medicare PPO | Attending: Emergency Medicine | Admitting: Emergency Medicine

## 2022-08-16 DIAGNOSIS — N200 Calculus of kidney: Secondary | ICD-10-CM

## 2022-08-16 DIAGNOSIS — N201 Calculus of ureter: Secondary | ICD-10-CM

## 2022-08-16 DIAGNOSIS — R109 Unspecified abdominal pain: Secondary | ICD-10-CM

## 2022-08-16 DIAGNOSIS — N23 Unspecified renal colic: Secondary | ICD-10-CM

## 2022-08-16 LAB — URINALYSIS, ROUTINE W REFLEX MICROSCOPIC
Bilirubin Urine: NEGATIVE
Glucose, UA: NEGATIVE mg/dL
Ketones, ur: NEGATIVE mg/dL
Nitrite: NEGATIVE
Protein, ur: 30 mg/dL — AB
RBC / HPF: >50 RBC/hpf — ABNORMAL HIGH (ref 0–5)
Specific Gravity, Urine: 1.028 (ref 1.005–1.030)
pH: 5 (ref 5.0–8.0)

## 2022-08-16 LAB — COMPREHENSIVE METABOLIC PANEL
ALT: 23 U/L (ref 0–44)
AST: 38 U/L (ref 15–41)
Albumin: 4.2 g/dL (ref 3.5–5.0)
Alkaline Phosphatase: 95 U/L (ref 38–126)
Anion gap: 12 (ref 5–15)
BUN: 18 mg/dL (ref 8–23)
CO2: 21 mmol/L — ABNORMAL LOW (ref 22–32)
Calcium: 9.8 mg/dL (ref 8.9–10.3)
Chloride: 110 mmol/L (ref 98–111)
Creatinine, Ser: 1.41 mg/dL — ABNORMAL HIGH (ref 0.44–1.00)
GFR, Estimated: 41 mL/min — ABNORMAL LOW (ref >60)
Glucose, Bld: 115 mg/dL — ABNORMAL HIGH (ref 70–99)
Potassium: 4.4 mmol/L (ref 3.5–5.1)
Sodium: 143 mmol/L (ref 135–145)
Total Bilirubin: 0.7 mg/dL (ref 0.3–1.2)
Total Protein: 7.8 g/dL (ref 6.5–8.1)

## 2022-08-16 MED ORDER — ONDANSETRON HCL 4 MG/2ML IJ SOLN
4.0000 mg | Freq: Once | INTRAMUSCULAR | Status: AC
  Administered 2022-08-16: 4 mg via INTRAVENOUS
  Filled 2022-08-16: qty 2

## 2022-08-16 MED ORDER — ONDANSETRON HCL 4 MG/2ML IJ SOLN
4.0000 mg | Freq: Once | INTRAMUSCULAR | Status: AC
Start: 2022-08-16 — End: 2022-08-16
  Administered 2022-08-16: 4 mg via INTRAVENOUS
  Filled 2022-08-16: qty 2

## 2022-08-16 MED ORDER — METOCLOPRAMIDE HCL 5 MG/ML IJ SOLN
10.0000 mg | Freq: Once | INTRAMUSCULAR | Status: AC
  Administered 2022-08-16: 10 mg via INTRAVENOUS
  Filled 2022-08-16: qty 2

## 2022-08-16 MED ORDER — OXYCODONE HCL 5 MG PO TABS: 5.0000 mg | ORAL_TABLET | Freq: Three times a day (TID) | ORAL | 0 refills | Status: DC | PRN

## 2022-08-16 MED ORDER — FENTANYL CITRATE PF 50 MCG/ML IJ SOSY
50.0000 ug | PREFILLED_SYRINGE | Freq: Once | INTRAMUSCULAR | Status: AC
  Administered 2022-08-16: 50 ug via INTRAVENOUS
  Filled 2022-08-16: qty 1

## 2022-08-16 MED ORDER — SODIUM CHLORIDE 0.9 % IV SOLN
1.5000 mg/kg | Freq: Once | INTRAVENOUS | Status: AC
  Administered 2022-08-16: 106 mg via INTRAVENOUS
  Filled 2022-08-16: qty 5.3

## 2022-08-16 MED ORDER — TAMSULOSIN HCL 0.4 MG PO CAPS
0.4000 mg | ORAL_CAPSULE | Freq: Once | ORAL | Status: AC
  Administered 2022-08-16: 0.4 mg via ORAL
  Filled 2022-08-16: qty 1

## 2022-08-16 MED ORDER — LACTATED RINGERS IV BOLUS
1000.0000 mL | Freq: Once | INTRAVENOUS | Status: AC
  Administered 2022-08-16: 1000 mL via INTRAVENOUS

## 2022-08-16 MED ORDER — TAMSULOSIN HCL 0.4 MG PO CAPS: 0.4000 mg | ORAL_CAPSULE | Freq: Every day | ORAL | 1 refills | Status: DC

## 2022-08-16 MED ORDER — HYDROMORPHONE HCL 1 MG/ML IJ SOLN
0.5000 mg | Freq: Once | INTRAMUSCULAR | Status: AC
  Administered 2022-08-16: 0.5 mg via INTRAVENOUS
  Filled 2022-08-16: qty 0.5

## 2022-08-16 MED ORDER — NITROFURANTOIN MONOHYD MACRO 100 MG PO CAPS: 100.0000 mg | ORAL_CAPSULE | Freq: Two times a day (BID) | ORAL | 0 refills | Status: DC

## 2022-08-16 MED ORDER — KETOROLAC TROMETHAMINE 30 MG/ML IJ SOLN: 15.0000 mg | Freq: Once | INTRAMUSCULAR | Status: DC

## 2022-08-16 MED ORDER — ONDANSETRON 4 MG PO TBDP
4.0000 mg | ORAL_TABLET | Freq: Three times a day (TID) | ORAL | 0 refills | Status: DC | PRN
Start: 2022-08-16 — End: 2022-09-12

## 2022-08-16 MED ORDER — PEG 3350-ELECTROLYTES 236 GRAM-22.74 GRAM-6.74 GRAM-5.86 GRAM SOLUTION
Freq: Once | ORAL | 0 refills | 1 days | Status: CP
Start: 2022-08-16 — End: 2022-08-16

## 2022-08-16 NOTE — H&P (View-Only) (Signed)
08/17/2022 10:33 AM   Cheryl Ferguson [name] [birth year] [id number]  Referring provider: Gaynelle Arabian, MD [address] [place],  [place] [address]  Chief Complaint  Patient presents with   Nephrolithiasis    HPI: 66 year old female who presents today for further evaluation of acute stone episode.  She was seen and evaluated in the emergency room on 08/15/2022 with acute onset left flank pain.  Is been worsening for a few days and was intermittent.  Ultimately became severe and she presented to the emergency room.  Noncontrast CT scan indicated a 6 mm left UPJ stone.  She had a mildly suspicious appearing urine with a urine culture pending.  Her creatinine was also mildly elevated 1.4 from a previous normal baseline of 1.1 but no leukocytosis or any other concerning findings.  Her pain was able to be controlled and she was discharged with outpatient follow-up.  She did undergo lithotripsy with Dr. Louis Meckel back in 2020 for an acute stone episode.  Effective.    She continues to have pain today.  No fevers or chills.  She is on antibiotics in the form of Macrobid for possible infection.  Preliminary urine is growing E. coli.   PMH: Past Medical History:  Diagnosis Date   Anemia    Anxiety    Aortic atherosclerosis ([place]) 03/19/2019   Noted in CT abd   Arthritis    Crohn disease ([place])    GERD (gastroesophageal reflux disease)    High cholesterol    History of kidney stones 04/10/2019   5m calculus in right ureter, noted on CT abd   Hydroureteronephrosis    Moderate, right,noted on CT abd   Meniere disease    Phlebitis    Bilateral lower legs   Seasonal allergies    Trigeminy    Ulcer of abdomen wall ([facility])    stomach ulcers   [place]    Surgical History: Past Surgical History:  Procedure Laterality Date   APPENDECTOMY     BOWEL RESECTION     CESAREAN SECTION     COLONOSCOPY  03/2019   EXTRACORPOREAL SHOCK WAVE LITHOTRIPSY  Right 04/16/2019   Procedure: EXTRACORPOREAL SHOCK WAVE LITHOTRIPSY (ESWL);  Surgeon: HArdis Hughs MD;  Location: [facility];  Service: Urology;  Laterality: Right;   FOOT SURGERY Left    OVARIAN CYST REMOVAL     TONSILLECTOMY     UPPER GI ENDOSCOPY      Home Medications:  Allergies as of 08/17/2022       Reactions   Iron Sucrose    Feet/legs/hand/face swelling, nausea, headaches,    Remicade [infliximab] Shortness Of Breath   Entocort Ec [budesonide] Other (See Comments)   Lowers Magnesium level   Codeine Nausea Only   Esomeprazole Magnesium    Drops magnesium and potassium   Zolpidem    Other reaction(s): Confusion Other reaction(s): Other (See Comments) Sleep walking   Zolpidem Tartrate Other (See Comments)   Sleep walking   Cephalexin Rash   Clindamycin Nausea Only   Other reaction(s): Other (See Comments) Severe Nausea / diarrhea   Trazodone Palpitations        Medication List        Accurate as of August 17, 2022 10:33 AM. If you have any questions, ask your nurse or doctor.          STOP taking these medications    B-12 (Methylcobalamin) 1000 MCG Subl Stopped by: AHollice Espy MD   Humira Pen  40 MG/0.8ML Pnkt Generic drug: Adalimumab Stopped by: Hollice Espy, MD   ibuprofen 800 MG tablet Commonly known as: ADVIL Stopped by: Hollice Espy, MD   mupirocin ointment 2 % Commonly known as: BACTROBAN Stopped by: Hollice Espy, MD   ondansetron 8 MG tablet Commonly known as: ZOFRAN Stopped by: Hollice Espy, MD   oxyCODONE-acetaminophen 5-325 MG tablet Commonly known as: Percocet Stopped by: Hollice Espy, MD   Pentasa 500 MG CR capsule Generic drug: mesalamine Stopped by: Hollice Espy, MD   potassium chloride SA 20 MEQ tablet Commonly known as: KLOR-CON M Stopped by: Hollice Espy, MD   promethazine 12.5 MG tablet Commonly known as: PHENERGAN Stopped by: Hollice Espy, MD   Valtrex 500 MG tablet Generic drug:  valACYclovir Stopped by: Hollice Espy, MD   verapamil 240 MG CR tablet Commonly known as: CALAN-SR Stopped by: Hollice Espy, MD       TAKE these medications    ALPRAZolam 0.5 MG tablet Commonly known as: XANAX Take by mouth. What changed: Another medication with the same name was removed. Continue taking this medication, and follow the directions you see here. Changed by: Hollice Espy, MD   Claritin 10 MG Caps Generic drug: Loratadine   cyanocobalamin 1000 MCG/ML injection Commonly known as: VITAMIN B12 every thirty (30) days.   famotidine 40 MG tablet Commonly known as: PEPCID Take by mouth. What changed: Another medication with the same name was removed. Continue taking this medication, and follow the directions you see here. Changed by: Hollice Espy, MD   ferrous sulfate 325 (65 FE) MG EC tablet Take 2 tablets by mouth in the morning and at bedtime. With food   nitrofurantoin (macrocrystal-monohydrate) 100 MG capsule Commonly known as: Macrobid Take 1 capsule (100 mg total) by mouth 2 (two) times daily for 7 days.   ondansetron 4 MG disintegrating tablet Commonly known as: ZOFRAN-ODT Take 1 tablet (4 mg total) by mouth every 8 (eight) hours as needed.   oxyCODONE 5 MG immediate release tablet Commonly known as: Roxicodone Take 1 tablet (5 mg total) by mouth every 8 (eight) hours as needed.   Potassium Chloride ER 20 MEQ Tbcr Take 1 tablet by mouth 2 (two) times daily.   rosuvastatin 5 MG tablet Commonly known as: CRESTOR Take 5 mg by mouth daily. 3 pm   simethicone 125 MG chewable tablet Commonly known as: MYLICON Take 2 tablets (250 mg) at 5:00 pm the day before your procedure. Take remaining 2 tablets (250 mg) at least 4 hours before your procedure time.   Stelara 90 MG/ML Sosy injection Generic drug: ustekinumab Inject into the skin. What changed: Another medication with the same name was removed. Continue taking this medication, and follow  the directions you see here. Changed by: Hollice Espy, MD   tamsulosin 0.4 MG Caps capsule Commonly known as: FLOMAX Take 1 capsule (0.4 mg total) by mouth daily.   Tylenol 8 Hour Arthritis Pain 650 MG CR tablet Generic drug: acetaminophen        Allergies:  Allergies  Allergen Reactions   Iron Sucrose     Feet/legs/hand/face swelling, nausea, headaches,    Remicade [Infliximab] Shortness Of Breath   Entocort Ec [Budesonide] Other (See Comments)    Lowers Magnesium level   Codeine Nausea Only   Esomeprazole Magnesium     Drops magnesium and potassium    Zolpidem     Other reaction(s): Confusion Other reaction(s): Other (See Comments) Sleep walking   Zolpidem Tartrate Other (See Comments)  Sleep walking    Cephalexin Rash   Clindamycin Nausea Only    Other reaction(s): Other (See Comments) Severe Nausea / diarrhea   Trazodone Palpitations    Family History: Family History  Problem Relation Age of Onset   Breast cancer Mother    Thyroid disease Mother    Hypertension Father    Hyperlipidemia Father    Ulcers Father    Stomach cancer Sister    Diabetes Brother    Hyperlipidemia Brother    Hypertension Brother    Hyperlipidemia Brother    Hypertension Brother    Hyperlipidemia Brother    Hypertension Brother    Breast cancer Maternal Aunt    Lymphoma Maternal Aunt    Colon cancer Paternal Uncle    Stomach cancer Paternal Uncle    Breast cancer Cousin    Breast cancer Cousin     Social History:  reports that she quit smoking about 13 years ago. Her smoking use included cigarettes. She has a 60.00 pack-year smoking history. She has never used smokeless tobacco. She reports current alcohol use. She reports that she does not use drugs.   Physical Exam: BP 96/61   Pulse (!) 112   Ht 5' 3"  (1.6 m)   Wt 159 lb (72.1 kg)   BMI 28.17 kg/m   Constitutional:  Alert and oriented, No acute distress.  Uncomfortable appearing.  Accompanied by her husband  today. HEENT: [place] AT, moist mucus membranes.  Trachea midline, no masses. Skin: No rashes, bruises or suspicious lesions. Neurologic: Grossly intact, no focal deficits, moving all 4 extremities. Psychiatric: Normal mood and affect.  Laboratory Data: Lab Results  Component Value Date   WBC 6.9 08/15/2022   HGB 12.1 08/15/2022   HCT 38.5 08/15/2022   MCV 99.5 08/15/2022   PLT 153 08/15/2022    Lab Results  Component Value Date   CREATININE 1.41 (H) 08/15/2022    Lab Results  Component Value Date   HGBA1C  01/21/2009    5.8 (NOTE)   The ADA recommends the following therapeutic goal for glycemic   control related to Hgb A1C measurement:   Goal of Therapy:   < 7.0% Hgb A1C   Reference: American Diabetes Association: Clinical Practice   Recommendations 2008, Diabetes Care,  2008, 31:(Suppl 1).    Urinalysis    Component Value Date/Time   COLORURINE YELLOW (A) 08/15/2022 2335   APPEARANCEUR HAZY (A) 08/15/2022 2335   LABSPEC 1.028 08/15/2022 2335   PHURINE 5.0 08/15/2022 2335   GLUCOSEU NEGATIVE 08/15/2022 2335   HGBUR LARGE (A) 08/15/2022 2335   BILIRUBINUR NEGATIVE 08/15/2022 [place] 08/15/2022 2335   PROTEINUR 30 (A) 08/15/2022 2335   UROBILINOGEN 0.2 01/30/2009 2119   NITRITE NEGATIVE 08/15/2022 2335   LEUKOCYTESUR LARGE (A) 08/15/2022 2335    Lab Results  Component Value Date   BACTERIA MANY (A) 08/15/2022    Pertinent Imaging: CT Renal Stone Study  Narrative CLINICAL DATA:  Flank pain, kidney stone suspected. Pt reports hematuria and left sided flank pain x3 days  EXAM: CT ABDOMEN AND PELVIS WITHOUT CONTRAST  TECHNIQUE: Multidetector CT imaging of the abdomen and pelvis was performed following the standard protocol without IV contrast.  RADIATION DOSE REDUCTION: This exam was performed according to the departmental dose-optimization program which includes automated exposure control, adjustment of the mA and/or kV according to patient  size and/or use of iterative reconstruction technique.  COMPARISON:  CT abdomen pelvis 06/06/2020  FINDINGS: Lower chest: No acute  abnormality.  Hepatobiliary: No focal liver abnormality. Contracted gallbladder. No CT evidence of gallstones, gallbladder wall thickening, or pericholecystic fluid. No biliary dilatation.  Pancreas: No focal lesion. Normal pancreatic contour. No surrounding inflammatory changes. No main pancreatic ductal dilatation.  Spleen: Normal in size without focal abnormality.  Adrenals/Urinary Tract:  No adrenal nodule bilaterally.  A 6 mm curvilinear calcification at the left ureteropelvic junction. Associated prominent left renal pelvis. Associated trace fullness of the left renal calices. No definite contour-deforming renal mass.  No ureterolithiasis or hydroureter.  The urinary bladder is unremarkable.  Stomach/Bowel: Status post appendectomy. Stomach is within normal limits. No evidence of bowel wall thickening or dilatation.  Vascular/Lymphatic: No abdominal aorta or iliac aneurysm. Mild atherosclerotic plaque of the aorta and its branches. No abdominal, pelvic, or inguinal lymphadenopathy.  Reproductive: Uterus and bilateral adnexa are unremarkable.  Other: No intraperitoneal free fluid. No intraperitoneal free gas. No organized fluid collection.  Musculoskeletal:  No abdominal wall hernia or abnormality.  No suspicious lytic or blastic osseous lesions. No acute displaced fracture. Grade 1 anterolisthesis of L3 on L4 and L4 on L5.  IMPRESSION: 1. Minimally/early obstructive 6 mm left ureteropelvic junction stone. 2.  Aortic Atherosclerosis (ICD10-I70.0).   Electronically Signed By: Iven Finn M.D. On: 08/16/2022 00:05  CT scan images were personally reviewed today and with the patient and her husband.  Hounsfield units approximately 1000.  Assessment & Plan:    1. Left ureteral stone Left obstructing ureteral calculus  without signs of systemic infection  We discussed various treatment options for urolithiasis including observation with or without medical expulsive therapy, shockwave lithotripsy (SWL), ureteroscopy and laser lithotripsy with stent placement, and percutaneous nephrolithotomy.   We discussed that management is based on stone size, location, density, patient co-morbidities, and patient preference.    Stones <26m in size have a >80% spontaneous passage rate. Data surrounding the use of tamsulosin for medical expulsive therapy is controversial, but meta analyses suggests it is most efficacious for distal stones between 5-168min size. Possible side effects include dizziness/lightheadedness, and retrograde ejaculation.   SWL has a lower stone free rate in a single procedure, but also a lower complication rate compared to ureteroscopy and avoids a stent and associated stent related symptoms. Possible complications include renal hematoma, steinstrasse, and need for additional treatment. We discussed the role of his increased skin to stone distance can lead to decreased efficacy with shockwave lithotripsy.   Ureteroscopy with laser lithotripsy and stent placement has a higher stone free rate than SWL in a single procedure, however increased complication rate including possible infection, ureteral injury, bleeding, and stent related morbidity. Common stent related symptoms include dysuria, urgency/frequency, and flank pain.   After an extensive discussion of the risks and benefits of the above treatment options, the patient would like to proceed with ESWL.  We did discuss indications for more urgent/emergent evaluation especially in the setting of her prelim urine culture.  I did go ahead and change her antibiotics over to Keflex today with more tissue penetration in case the infection is behind if she has any fevers, chills, or any other signs that are concerning, she should go to emergency room immediately  for urgent intervention.  Plan to follow-up urine culture  Narcotic refill   2. Acute cystitis without hematuria As above   AsHollice EspyMD  [id number] [id number]

## 2022-08-16 NOTE — ED Provider Notes (Signed)
-----------------------------------------   8:15 AM on 08/16/2022 ----------------------------------------- Patient care assumed from Dr. Tamala Julian.  Patient is feeling better after medications.  We will discharge with Flomax, Percocet, Zofran we will have the patient follow-up with Dr. Erlene Quan.  Given the patient's urine we will cover with antibiotic as a precaution.  Urine culture has been sent.  Patient agreeable to plan of care.  Provided my typical kidney stone return precautions.   Harvest Dark, MD 08/16/22 [phone]

## 2022-08-16 NOTE — Progress Notes (Signed)
08/17/2022 10:33 AM   Cheryl Ferguson Cheryl Ferguson 11/29/1956 [id number]  Referring provider: Gaynelle Arabian, MD [address] [place],   [address]  Chief Complaint  Patient presents with   Nephrolithiasis    HPI: 66 year old female who presents today for further evaluation of acute stone episode.  She was seen and evaluated in the emergency room on 08/15/2022 with acute onset left flank pain.  Is been worsening for a few days and was intermittent.  Ultimately became severe and she presented to the emergency room.  Noncontrast CT scan indicated a 6 mm left UPJ stone.  She had a mildly suspicious appearing urine with a urine culture pending.  Her creatinine was also mildly elevated 1.4 from a previous normal baseline of 1.1 but no leukocytosis or any other concerning findings.  Her pain was able to be controlled and she was discharged with outpatient follow-up.  She did undergo lithotripsy with Dr. Louis Meckel back in 2020 for an acute stone episode.  Effective.    She continues to have pain today.  No fevers or chills.  She is on antibiotics in the form of Macrobid for possible infection.  Preliminary urine is growing E. coli.   PMH: Past Medical History:  Diagnosis Date   Anemia    Anxiety    Aortic atherosclerosis ([place]) 03/19/2019   Noted in CT abd   Arthritis    Crohn disease ([place])    GERD (gastroesophageal reflux disease)    High cholesterol    History of kidney stones 04/10/2019   85m calculus in right ureter, noted on CT abd   Hydroureteronephrosis    Moderate, right,noted on CT abd   Meniere disease    Phlebitis    Bilateral lower legs   Seasonal allergies    Trigeminy    Ulcer of abdomen wall ([facility])    stomach ulcers   Ventricular tachycardia ([place] 2009    Surgical History: Past Surgical History:  Procedure Laterality Date   APPENDECTOMY     BOWEL RESECTION     CESAREAN SECTION     COLONOSCOPY  03/2019   EXTRACORPOREAL SHOCK WAVE LITHOTRIPSY  Right 04/16/2019   Procedure: EXTRACORPOREAL SHOCK WAVE LITHOTRIPSY (ESWL);  Surgeon: HArdis Hughs MD;  Location: [facility];  Service: Urology;  Laterality: Right;   FOOT SURGERY Left    OVARIAN CYST REMOVAL     TONSILLECTOMY     UPPER GI ENDOSCOPY      Home Medications:  Allergies as of 08/17/2022       Reactions   Iron Sucrose    Feet/legs/hand/face swelling, nausea, headaches,    Remicade [infliximab] Shortness Of Breath   Entocort Ec [budesonide] Other (See Comments)   Lowers Magnesium level   Codeine Nausea Only   Esomeprazole Magnesium    Drops magnesium and potassium   Zolpidem    Other reaction(s): Confusion Other reaction(s): Other (See Comments) Sleep walking   Zolpidem Tartrate Other (See Comments)   Sleep walking   Cephalexin Rash   Clindamycin Nausea Only   Other reaction(s): Other (See Comments) Severe Nausea / diarrhea   Trazodone Palpitations        Medication List        Accurate as of August 17, 2022 10:33 AM. If you have any questions, ask your nurse or doctor.          STOP taking these medications    B-12 (Methylcobalamin) 1000 MCG Subl Stopped by: AHollice Espy MD   Humira Pen  40 MG/0.8ML Pnkt Generic drug: Adalimumab Stopped by: Hollice Espy, MD   ibuprofen 800 MG tablet Commonly known as: ADVIL Stopped by: Hollice Espy, MD   mupirocin ointment 2 % Commonly known as: BACTROBAN Stopped by: Hollice Espy, MD   ondansetron 8 MG tablet Commonly known as: ZOFRAN Stopped by: Hollice Espy, MD   oxyCODONE-acetaminophen 5-325 MG tablet Commonly known as: Percocet Stopped by: Hollice Espy, MD   Pentasa 500 MG CR capsule Generic drug: mesalamine Stopped by: Hollice Espy, MD   potassium chloride SA 20 MEQ tablet Commonly known as: KLOR-CON M Stopped by: Hollice Espy, MD   promethazine 12.5 MG tablet Commonly known as: PHENERGAN Stopped by: Hollice Espy, MD   Valtrex 500 MG tablet Generic drug:  valACYclovir Stopped by: Hollice Espy, MD   verapamil 240 MG CR tablet Commonly known as: CALAN-SR Stopped by: Hollice Espy, MD       TAKE these medications    ALPRAZolam 0.5 MG tablet Commonly known as: XANAX Take by mouth. What changed: Another medication with the same name was removed. Continue taking this medication, and follow the directions you see here. Changed by: Hollice Espy, MD   Claritin 10 MG Caps Generic drug: Loratadine   cyanocobalamin 1000 MCG/ML injection Commonly known as: VITAMIN B12 every thirty (30) days.   famotidine 40 MG tablet Commonly known as: PEPCID Take by mouth. What changed: Another medication with the same name was removed. Continue taking this medication, and follow the directions you see here. Changed by: Hollice Espy, MD   ferrous sulfate 325 (65 FE) MG EC tablet Take 2 tablets by mouth in the morning and at bedtime. With food   nitrofurantoin (macrocrystal-monohydrate) 100 MG capsule Commonly known as: Macrobid Take 1 capsule (100 mg total) by mouth 2 (two) times daily for 7 days.   ondansetron 4 MG disintegrating tablet Commonly known as: ZOFRAN-ODT Take 1 tablet (4 mg total) by mouth every 8 (eight) hours as needed.   oxyCODONE 5 MG immediate release tablet Commonly known as: Roxicodone Take 1 tablet (5 mg total) by mouth every 8 (eight) hours as needed.   Potassium Chloride ER 20 MEQ Tbcr Take 1 tablet by mouth 2 (two) times daily.   rosuvastatin 5 MG tablet Commonly known as: CRESTOR Take 5 mg by mouth daily. 3 pm   simethicone 125 MG chewable tablet Commonly known as: MYLICON Take 2 tablets (250 mg) at 5:00 pm the day before your procedure. Take remaining 2 tablets (250 mg) at least 4 hours before your procedure time.   Stelara 90 MG/ML Sosy injection Generic drug: ustekinumab Inject into the skin. What changed: Another medication with the same name was removed. Continue taking this medication, and follow  the directions you see here. Changed by: Hollice Espy, MD   tamsulosin 0.4 MG Caps capsule Commonly known as: FLOMAX Take 1 capsule (0.4 mg total) by mouth daily.   Tylenol 8 Hour Arthritis Pain 650 MG CR tablet Generic drug: acetaminophen        Allergies:  Allergies  Allergen Reactions   Iron Sucrose     Feet/legs/hand/face swelling, nausea, headaches,    Remicade [Infliximab] Shortness Of Breath   Entocort Ec [Budesonide] Other (See Comments)    Lowers Magnesium level   Codeine Nausea Only   Esomeprazole Magnesium     Drops magnesium and potassium    Zolpidem     Other reaction(s): Confusion Other reaction(s): Other (See Comments) Sleep walking   Zolpidem Tartrate Other (See Comments)  Sleep walking    Cephalexin Rash   Clindamycin Nausea Only    Other reaction(s): Other (See Comments) Severe Nausea / diarrhea   Trazodone Palpitations    Family History: Family History  Problem Relation Age of Onset   Breast cancer Mother    Thyroid disease Mother    Hypertension Father    Hyperlipidemia Father    Ulcers Father    Stomach cancer Sister    Diabetes Brother    Hyperlipidemia Brother    Hypertension Brother    Hyperlipidemia Brother    Hypertension Brother    Hyperlipidemia Brother    Hypertension Brother    Breast cancer Maternal Aunt    Lymphoma Maternal Aunt    Colon cancer Paternal Uncle    Stomach cancer Paternal Uncle    Breast cancer Cousin    Breast cancer Cousin     Social History:  reports that she quit smoking about 13 years ago. Her smoking use included cigarettes. She has a 60.00 pack-year smoking history. She has never used smokeless tobacco. She reports current alcohol use. She reports that she does not use drugs.   Physical Exam: BP 96/61   Pulse (!) 112   Ht 5' 3"  (1.6 m)   Wt 159 lb (72.1 kg)   BMI 28.17 kg/m   Constitutional:  Alert and oriented, No acute distress.  Uncomfortable appearing.  Accompanied by her husband  today. HEENT: [place] AT, moist mucus membranes.  Trachea midline, no masses. Skin: No rashes, bruises or suspicious lesions. Neurologic: Grossly intact, no focal deficits, moving all 4 extremities. Psychiatric: Normal mood and affect.  Laboratory Data: Lab Results  Component Value Date   WBC 6.9 08/15/2022   HGB 12.1 08/15/2022   HCT 38.5 08/15/2022   MCV 99.5 08/15/2022   PLT 153 08/15/2022    Lab Results  Component Value Date   CREATININE 1.41 (H) 08/15/2022    Lab Results  Component Value Date   HGBA1C  01/21/2009    5.8 (NOTE)   The ADA recommends the following therapeutic goal for glycemic   control related to Hgb A1C measurement:   Goal of Therapy:   < 7.0% Hgb A1C   Reference: American Diabetes Association: Clinical Practice   Recommendations 2008, Diabetes Care,  2008, 31:(Suppl 1).    Urinalysis    Component Value Date/Time   COLORURINE YELLOW (A) 08/15/2022 2335   APPEARANCEUR HAZY (A) 08/15/2022 2335   LABSPEC 1.028 08/15/2022 2335   PHURINE 5.0 08/15/2022 2335   GLUCOSEU NEGATIVE 08/15/2022 2335   HGBUR LARGE (A) 08/15/2022 2335   BILIRUBINUR NEGATIVE 08/15/2022 [place] 08/15/2022 2335   PROTEINUR 30 (A) 08/15/2022 2335   UROBILINOGEN 0.2 01/30/2009 2119   NITRITE NEGATIVE 08/15/2022 2335   LEUKOCYTESUR LARGE (A) 08/15/2022 2335    Lab Results  Component Value Date   BACTERIA MANY (A) 08/15/2022    Pertinent Imaging: CT Renal Stone Study  Narrative CLINICAL DATA:  Flank pain, kidney stone suspected. Pt reports hematuria and left sided flank pain x3 days  EXAM: CT ABDOMEN AND PELVIS WITHOUT CONTRAST  TECHNIQUE: Multidetector CT imaging of the abdomen and pelvis was performed following the standard protocol without IV contrast.  RADIATION DOSE REDUCTION: This exam was performed according to the departmental dose-optimization program which includes automated exposure control, adjustment of the mA and/or kV according to patient  size and/or use of iterative reconstruction technique.  COMPARISON:  CT abdomen pelvis 06/06/2020  FINDINGS: Lower chest: No acute  abnormality.  Hepatobiliary: No focal liver abnormality. Contracted gallbladder. No CT evidence of gallstones, gallbladder wall thickening, or pericholecystic fluid. No biliary dilatation.  Pancreas: No focal lesion. Normal pancreatic contour. No surrounding inflammatory changes. No main pancreatic ductal dilatation.  Spleen: Normal in size without focal abnormality.  Adrenals/Urinary Tract:  No adrenal nodule bilaterally.  A 6 mm curvilinear calcification at the left ureteropelvic junction. Associated prominent left renal pelvis. Associated trace fullness of the left renal calices. No definite contour-deforming renal mass.  No ureterolithiasis or hydroureter.  The urinary bladder is unremarkable.  Stomach/Bowel: Status post appendectomy. Stomach is within normal limits. No evidence of bowel wall thickening or dilatation.  Vascular/Lymphatic: No abdominal aorta or iliac aneurysm. Mild atherosclerotic plaque of the aorta and its branches. No abdominal, pelvic, or inguinal lymphadenopathy.  Reproductive: Uterus and bilateral adnexa are unremarkable.  Other: No intraperitoneal free fluid. No intraperitoneal free gas. No organized fluid collection.  Musculoskeletal:  No abdominal wall hernia or abnormality.  No suspicious lytic or blastic osseous lesions. No acute displaced fracture. Grade 1 anterolisthesis of L3 on L4 and L4 on L5.  IMPRESSION: 1. Minimally/early obstructive 6 mm left ureteropelvic junction stone. 2.  Aortic Atherosclerosis (ICD10-I70.0).   Electronically Signed By: Iven Finn M.D. On: 08/16/2022 00:05  CT scan images were personally reviewed today and with the patient and her husband.  Hounsfield units approximately 1000.  Assessment & Plan:    1. Left ureteral stone Left obstructing ureteral calculus  without signs of systemic infection  We discussed various treatment options for urolithiasis including observation with or without medical expulsive therapy, shockwave lithotripsy (SWL), ureteroscopy and laser lithotripsy with stent placement, and percutaneous nephrolithotomy.   We discussed that management is based on stone size, location, density, patient co-morbidities, and patient preference.    Stones <73m in size have a >80% spontaneous passage rate. Data surrounding the use of tamsulosin for medical expulsive therapy is controversial, but meta analyses suggests it is most efficacious for distal stones between 5-110min size. Possible side effects include dizziness/lightheadedness, and retrograde ejaculation.   SWL has a lower stone free rate in a single procedure, but also a lower complication rate compared to ureteroscopy and avoids a stent and associated stent related symptoms. Possible complications include renal hematoma, steinstrasse, and need for additional treatment. We discussed the role of his increased skin to stone distance can lead to decreased efficacy with shockwave lithotripsy.   Ureteroscopy with laser lithotripsy and stent placement has a higher stone free rate than SWL in a single procedure, however increased complication rate including possible infection, ureteral injury, bleeding, and stent related morbidity. Common stent related symptoms include dysuria, urgency/frequency, and flank pain.   After an extensive discussion of the risks and benefits of the above treatment options, the patient would like to proceed with ESWL.  We did discuss indications for more urgent/emergent evaluation especially in the setting of her prelim urine culture.  I did go ahead and change her antibiotics over to Keflex today with more tissue penetration in case the infection is behind if she has any fevers, chills, or any other signs that are concerning, she should go to emergency room immediately  for urgent intervention.  Plan to follow-up urine culture  Narcotic refill   2. Acute cystitis without hematuria As above   AsHollice EspyMD  BuSk[facility] Center[address] StreetSuLa PazuReweyNC [phone]

## 2022-08-16 NOTE — Discharge Instructions (Addendum)
Use Tylenol for pain and fevers.  Up to 1000 mg per dose, up to 4 times per day.  Do not take more than 4000 mg of Tylenol/acetaminophen within 24 hours..  Use the oxycodone as needed for more severe/breakthrough pain.  Use the Flomax/tamsulosin once a day every day to help relax your ureter and pass any kidney stones.  Follow-up with the urologist in the clinic but return to the ED with any worsening symptoms in the meantime.

## 2022-08-16 NOTE — ED Provider Notes (Signed)
[facility] Provider Note    Event Date/Time   First MD Initiated Contact with Patient 08/16/22 0210     (approximate)   History   Flank Pain   HPI  Cheryl Ferguson is a 66 y.o. female who presents to the ED for evaluation of Flank Pain   Patient presents to the ED for the evaluation of acute left-sided flank pain.  Intermittent for a few days, worsening tonight.  Pain became intolerable so she presents to the ED.  No fevers or dysuria.  Reports poor intake and nausea and emesis alongside the severity of the pain. Reports having 1 kidney stone in the past that required lithotripsy   Physical Exam   Triage Vital Signs: ED Triage Vitals  Enc Vitals Group     BP 08/15/22 2327 139/84     Pulse Rate 08/15/22 2327 68     Resp 08/15/22 2327 18     Temp 08/15/22 2327 97.9 F (36.6 C)     Temp Source 08/15/22 2327 Oral     SpO2 08/15/22 2327 100 %     Weight 08/16/22 0539 155 lb (70.3 kg)     Height --      Head Circumference --      Peak Flow --      Pain Score 08/16/22 0228 7     Pain Loc --      Pain Edu? --      Excl. in [place]? --     Most recent vital signs: Vitals:   08/16/22 0600 08/16/22 0630  BP: (!) 141/91 (!) 126/103  Pulse: (!) 57 72  Resp: 19 (!) 23  Temp:  97.6 F (36.4 C)  SpO2: 95% 98%    General: Awake, no distress.  Obviously uncomfortable CV:  Good peripheral perfusion.  Resp:  Normal effort.  Abd:  No distention.  Poorly localizing left-sided CVA tenderness and left flank tenderness without peritoneal abdominal features.  Frontal abdomen is benign MSK:  No deformity noted.  Neuro:  No focal deficits appreciated. Other:     ED Results / Procedures / Treatments   Labs (all labs ordered are listed, but only abnormal results are displayed) Labs Reviewed  COMPREHENSIVE METABOLIC PANEL - Abnormal; Notable for the following components:      Result Value   CO2 21 (*)    Glucose, Bld 115 (*)    Creatinine, Ser 1.41  (*)    GFR, Estimated 41 (*)    All other components within normal limits  URINALYSIS, ROUTINE W REFLEX MICROSCOPIC - Abnormal; Notable for the following components:   Color, Urine YELLOW (*)    APPearance HAZY (*)    Hgb urine dipstick LARGE (*)    Protein, ur 30 (*)    Leukocytes,Ua LARGE (*)    RBC / HPF >50 (*)    Bacteria, UA MANY (*)    All other components within normal limits  URINE CULTURE  CBC WITH DIFFERENTIAL/PLATELET    EKG   RADIOLOGY CT renal study interpreted by me with left moderately sized UVJ stone  Official radiology report(s): CT Renal Stone Study  Result Date: 08/16/2022 CLINICAL DATA:  Flank pain, kidney stone suspected. Pt reports hematuria and left sided flank pain x3 days EXAM: CT ABDOMEN AND PELVIS WITHOUT CONTRAST TECHNIQUE: Multidetector CT imaging of the abdomen and pelvis was performed following the standard protocol without IV contrast. RADIATION DOSE REDUCTION: This exam was performed according to the departmental dose-optimization program which  includes automated exposure control, adjustment of the mA and/or kV according to patient size and/or use of iterative reconstruction technique. COMPARISON:  CT abdomen pelvis 06/06/2020 FINDINGS: Lower chest: No acute abnormality. Hepatobiliary: No focal liver abnormality. Contracted gallbladder. No CT evidence of gallstones, gallbladder wall thickening, or pericholecystic fluid. No biliary dilatation. Pancreas: No focal lesion. Normal pancreatic contour. No surrounding inflammatory changes. No main pancreatic ductal dilatation. Spleen: Normal in size without focal abnormality. Adrenals/Urinary Tract: No adrenal nodule bilaterally. A 6 mm curvilinear calcification at the left ureteropelvic junction. Associated prominent left renal pelvis. Associated trace fullness of the left renal calices. No definite contour-deforming renal mass. No ureterolithiasis or hydroureter. The urinary bladder is unremarkable. Stomach/Bowel:  Status post appendectomy. Stomach is within normal limits. No evidence of bowel wall thickening or dilatation. Vascular/Lymphatic: No abdominal aorta or iliac aneurysm. Mild atherosclerotic plaque of the aorta and its branches. No abdominal, pelvic, or inguinal lymphadenopathy. Reproductive: Uterus and bilateral adnexa are unremarkable. Other: No intraperitoneal free fluid. No intraperitoneal free gas. No organized fluid collection. Musculoskeletal: No abdominal wall hernia or abnormality. No suspicious lytic or blastic osseous lesions. No acute displaced fracture. Grade 1 anterolisthesis of L3 on L4 and L4 on L5. IMPRESSION: 1. Minimally/early obstructive 6 mm left ureteropelvic junction stone. 2.  Aortic Atherosclerosis (ICD10-I70.0). Electronically Signed   By: Iven Finn M.D.   On: 08/16/2022 00:05    PROCEDURES and INTERVENTIONS:  .1-3 Lead EKG Interpretation  Performed by: Vladimir Crofts, MD Authorized by: Vladimir Crofts, MD     Interpretation: normal     ECG rate:  70   ECG rate assessment: normal     Rhythm: sinus rhythm     Ectopy: none     Conduction: normal     Medications  lactated ringers bolus 1,000 mL (0 mLs Intravenous Stopped 08/16/22 0540)  tamsulosin (FLOMAX) capsule 0.4 mg (0.4 mg Oral Given 08/16/22 0231)  ondansetron (ZOFRAN) injection 4 mg (4 mg Intravenous Given 08/16/22 0232)  HYDROmorphone (DILAUDID) injection 0.5 mg (0.5 mg Intravenous Given 08/16/22 0232)  lactated ringers bolus 1,000 mL (0 mLs Intravenous Stopped 08/16/22 0540)  ondansetron (ZOFRAN) injection 4 mg (4 mg Intravenous Given 08/16/22 0437)  fentaNYL (SUBLIMAZE) injection 50 mcg (50 mcg Intravenous Given 08/16/22 0437)  lidocaine (XYLOCAINE) 106 mg in sodium chloride 0.9 % 100 mL IVPB (106 mg Intravenous New Bag/Given 08/16/22 0641)  metoCLOPramide (REGLAN) injection 10 mg (10 mg Intravenous Given 08/16/22 0639)     IMPRESSION / MDM / [place] / ED COURSE  I reviewed the triage vital signs and the  nursing notes.  Differential diagnosis includes, but is not limited to, ureterolithiasis, sepsis, pyelonephritis, cystitis  {Patient presents with symptoms of an acute illness or injury that is potentially life-threatening.  CT 18-year-old woman presents to the ED with evidence of ureteral colic from a 6 mm left-sided UVJ stone.  She is obviously uncomfortable but does not have a peritoneal abdomen.  Urine with hematuria but no infectious features, and I doubt cystitis, considering her lack of dysuria, leukocytosis or other infectious signs.  No signs of sepsis or leukocytosis.  Metabolic panel with renal dysfunction around baseline.  After multiple rounds of fluids and analgesia, she eventually has controlled and relief of pain after IV lidocaine.  This occurred around the time of signout.  We will observe and reassess after p.o. challenge and hopefully discharge with close urology follow-up.  If she has recurrence of severe pain she will likely need admission for pain  control and urology evaluation.  Clinical Course as of 08/16/22 0708  Thu Aug 16, 2022  0402 Reassessed.  Had been feeling better, but reports the pain and nausea starting to recur [DS]  0518 Reassessed.  Still uncomfortable.  We will try IV lidocaine [DS]  6301 Reassessed.  Feeling well and has relief of pain.  We discussed p.o. challenge, brief period of observation and hopefully outpatient management with urology follow-up.  She will be signed out to oncoming provider. [DS]    Clinical Course User Index [DS] Vladimir Crofts, MD     FINAL CLINICAL IMPRESSION(S) / ED DIAGNOSES   Final diagnoses:  Left flank pain  Ureterolithiasis  Nephrolithiasis  Ureteral colic     Rx / DC Orders   ED Discharge Orders          Ordered    oxyCODONE (ROXICODONE) 5 MG immediate release tablet  Every 8 hours PRN        08/16/22 0657    ondansetron (ZOFRAN-ODT) 4 MG disintegrating tablet  Every 8 hours PRN        08/16/22 0657     tamsulosin (FLOMAX) 0.4 MG CAPS capsule  Daily        08/16/22 0701             Note:  This document was prepared using Dragon voice recognition software and may include unintentional dictation errors.   Vladimir Crofts, MD 08/16/22 [phone]

## 2022-08-16 NOTE — ED Notes (Signed)
Pt A&O, IV removed, pt given discharge instructions, pt ambulating with steady gait.

## 2022-08-17 ENCOUNTER — Ambulatory Visit (INDEPENDENT_AMBULATORY_CARE_PROVIDER_SITE_OTHER): Payer: Medicare PPO | Admitting: Urology

## 2022-08-17 ENCOUNTER — Encounter: Payer: Self-pay | Admitting: Urology

## 2022-08-17 VITALS — BP 96/61 | HR 112 | Ht 63.0 in | Wt 159.0 lb

## 2022-08-17 DIAGNOSIS — N201 Calculus of ureter: Secondary | ICD-10-CM | POA: Diagnosis not present

## 2022-08-17 DIAGNOSIS — N3000 Acute cystitis without hematuria: Secondary | ICD-10-CM

## 2022-08-17 MED ORDER — OXYCODONE HCL 5 MG PO TABS: 5.0000 mg | ORAL_TABLET | Freq: Three times a day (TID) | ORAL | 0 refills | Status: DC | PRN

## 2022-08-17 MED ORDER — SULFAMETHOXAZOLE-TRIMETHOPRIM 800-160 MG PO TABS: 1.0000 | ORAL_TABLET | Freq: Two times a day (BID) | ORAL | 0 refills | Status: DC

## 2022-08-18 LAB — URINE CULTURE: Culture: >=100000 — AB

## 2022-08-20 ENCOUNTER — Other Ambulatory Visit: Payer: Self-pay

## 2022-08-20 DIAGNOSIS — N201 Calculus of ureter: Secondary | ICD-10-CM

## 2022-08-20 NOTE — Addendum Note (Signed)
Addended by: Gerald Leitz A on: 08/20/2022 09:10 AM   Modules accepted: Orders

## 2022-08-20 NOTE — Progress Notes (Addendum)
[facility] ORDER FORM  Expected date of procedure: 08/23/2022  Surgeon: John Giovanni, MD  Post op standing: 2-4wk follow up w/KUB prior  Anticoagulation/Aspirin/NSAID standing order: Hold all 72 hours prior  Anesthesia standing order: MAC  VTE standing: SCD's  Dx: Left Ureteral Stone  Procedure: left Extracorporeal shock wave lithotripsy  CPT : 25366  Standing Order Set:   *NPO after mn, KUB  *NS 182m/hr, Benadryl 212mPO, Valium 1059mO, Zofran 4mg38m No ABX due to allergy per Dr. BranErlene Quan    Medications if other than standing orders:   NONE

## 2022-08-22 MED ORDER — DIPHENHYDRAMINE HCL 25 MG PO CAPS: 25.0000 mg | ORAL_CAPSULE | ORAL | Status: AC

## 2022-08-22 MED ORDER — SODIUM CHLORIDE 0.9 % IV SOLN: INTRAVENOUS | Status: DC

## 2022-08-22 MED ORDER — ONDANSETRON HCL 4 MG/2ML IJ SOLN: 4.0000 mg | Freq: Once | INTRAMUSCULAR | Status: AC

## 2022-08-22 MED ORDER — DIAZEPAM 5 MG PO TABS: 10.0000 mg | ORAL_TABLET | ORAL | Status: AC

## 2022-08-23 ENCOUNTER — Encounter
Admission: RE | Disposition: A | Discharge status: Home / Self Care | Payer: Self-pay | Source: Home / Self Care | Attending: Urology

## 2022-08-23 ENCOUNTER — Encounter: Payer: Self-pay | Admitting: Urology

## 2022-08-23 ENCOUNTER — Ambulatory Visit
Admission: RE | Admit: 2022-08-23 | Discharge: 2022-08-23 | Disposition: A | Discharge status: Home / Self Care | Payer: Medicare PPO | Attending: Urology | Admitting: Urology

## 2022-08-23 ENCOUNTER — Ambulatory Visit: Payer: Medicare PPO

## 2022-08-23 ENCOUNTER — Other Ambulatory Visit: Payer: Self-pay

## 2022-08-23 DIAGNOSIS — N200 Calculus of kidney: Secondary | ICD-10-CM

## 2022-08-23 DIAGNOSIS — Z87891 Personal history of nicotine dependence: Secondary | ICD-10-CM | POA: Diagnosis not present

## 2022-08-23 DIAGNOSIS — N201 Calculus of ureter: Secondary | ICD-10-CM | POA: Insufficient documentation

## 2022-08-23 DIAGNOSIS — Z01818 Encounter for other preprocedural examination: Secondary | ICD-10-CM | POA: Diagnosis not present

## 2022-08-23 HISTORY — PX: EXTRACORPOREAL SHOCK WAVE LITHOTRIPSY: SHX1557

## 2022-08-23 SURGERY — LITHOTRIPSY, ESWL
Anesthesia: Moderate Sedation | Laterality: Left

## 2022-08-23 MED ORDER — DIPHENHYDRAMINE HCL 25 MG PO CAPS
ORAL_CAPSULE | ORAL | Status: AC
  Administered 2022-08-23: 25 mg via ORAL
  Filled 2022-08-23: qty 1

## 2022-08-23 MED ORDER — ONDANSETRON HCL 4 MG/2ML IJ SOLN
INTRAMUSCULAR | Status: AC
  Administered 2022-08-23: 4 mg via INTRAVENOUS
  Filled 2022-08-23: qty 2

## 2022-08-23 MED ORDER — DIAZEPAM 5 MG PO TABS
ORAL_TABLET | ORAL | Status: AC
  Administered 2022-08-23: 10 mg via ORAL
  Filled 2022-08-23: qty 2

## 2022-08-23 NOTE — Discharge Instructions (Addendum)
As per the [facility] discharge instructions Continue oxycodone as needed for pain Continue tamsulosin which will help you pass stone fragments was also sent to your pharmacy [facility] at [phone] for pain not controlled with oral medications or fever greater than 101 degrees You we will be contacted by our office for a postop follow-up appointment   [place]   The drugs that you were given will stay in your system until tomorrow so for the next 24 hours you should not:  Drive an automobile Make any legal decisions Drink any alcoholic beverage   You may resume regular meals tomorrow.  Today it is better to start with liquids and gradually work up to solid foods.  You may eat anything you prefer, but it is better to start with liquids, then soup and crackers, and gradually work up to solid foods.   Please notify your doctor immediately if you have any unusual bleeding, trouble breathing, redness and pain at the surgery site, drainage, fever, or pain not relieved by medication.    Additional Instructions:   Please contact your physician with any problems or Same Day Surgery at [phone], Monday through Friday 6 am to 4 pm, or [place] at [facility] number at [phone].

## 2022-08-23 NOTE — Interval H&P Note (Signed)
History and Physical Interval Note:  CV:RRR Lungs:clear  08/23/2022 11:18 AM  Cheryl Ferguson  has presented today for surgery, with the diagnosis of Left Ureteral Stone.  The various methods of treatment have been discussed with the patient and family. After consideration of risks, benefits and other options for treatment, the patient has consented to  Procedure(s): EXTRACORPOREAL SHOCK WAVE LITHOTRIPSY (ESWL) (Left) as a surgical intervention.  The patient's history has been reviewed, patient examined, no change in status, stable for surgery.  I have reviewed the patient's chart and labs.  Questions were answered to the patient's satisfaction.     [name]

## 2022-08-24 ENCOUNTER — Encounter: Payer: Self-pay | Admitting: Urology

## 2022-08-29 DIAGNOSIS — R197 Diarrhea, unspecified: Secondary | ICD-10-CM | POA: Diagnosis not present

## 2022-08-31 ENCOUNTER — Telehealth: Payer: Self-pay | Admitting: Physician Assistant

## 2022-08-31 ENCOUNTER — Ambulatory Visit: Payer: Medicare PPO | Admitting: Physician Assistant

## 2022-08-31 ENCOUNTER — Encounter: Payer: Self-pay | Admitting: Physician Assistant

## 2022-08-31 VITALS — BP 116/77 | HR 80 | Ht 63.0 in | Wt 151.0 lb

## 2022-08-31 DIAGNOSIS — R3989 Other symptoms and signs involving the genitourinary system: Secondary | ICD-10-CM | POA: Diagnosis not present

## 2022-08-31 DIAGNOSIS — R300 Dysuria: Secondary | ICD-10-CM | POA: Diagnosis not present

## 2022-08-31 DIAGNOSIS — R310 Gross hematuria: Secondary | ICD-10-CM | POA: Diagnosis not present

## 2022-08-31 DIAGNOSIS — R3129 Other microscopic hematuria: Secondary | ICD-10-CM | POA: Diagnosis not present

## 2022-08-31 LAB — MICROSCOPIC EXAMINATION: WBC, UA: >30 /hpf — AB (ref 0–5)

## 2022-08-31 LAB — URINALYSIS, COMPLETE
Bilirubin, UA: NEGATIVE
Glucose, UA: NEGATIVE
Nitrite, UA: NEGATIVE
Specific Gravity, UA: 1.02 (ref 1.005–1.030)
Urobilinogen, Ur: 0.2 mg/dL (ref 0.2–1.0)
pH, UA: 5 (ref 5.0–7.5)

## 2022-08-31 MED ORDER — CIPROFLOXACIN HCL 250 MG PO TABS: 250.0000 mg | ORAL_TABLET | Freq: Two times a day (BID) | ORAL | 0 refills | Status: AC

## 2022-08-31 NOTE — Progress Notes (Unsigned)
08/31/2022 3:39 PM   Cheryl Ferguson Cheryl Ferguson [birth year] [id number]  CC: Chief Complaint  Patient presents with   Urinary Tract Infection   HPI: Cheryl Ferguson is a 66 y.o. female with PMH nephrolithiasis and Crohn's disease who underwent ESWL with Dr. Bernardo Heater 8 days ago for management of a 6 mm left UPJ stone who presents today for evaluation of possible UTI.  Operative note describes fragmentation of the stone.  She had been on Bactrim for a suspicious appearing urine when she was seen in the emergency department on 08/16/2022.  Urine culture at that time finalized with ampicillin resistant E. coli.  She completed Bactrim 1 week ago.  Today she reports onset of gross hematuria yesterday afternoon that has since resolved.  She developed dysuria last night and reports some lower abdominal discomfort in clinic today.  She has passed several stone fragments.  She reports some chills and diarrhea but denies fever, flank pain, nausea, or vomiting.  In-office UA today positive for trace ketones, 2+ blood, 1+ protein, and 1+ leukocytes; urine microscopy with >30 WBCs/HPF, 11-30 RBCs/HPF, and many bacteria.   PMH: Past Medical History:  Diagnosis Date   Anemia    Anxiety    Aortic atherosclerosis ([place]) 03/19/2019   Noted in CT abd   Arthritis    Crohn disease ([place])    GERD (gastroesophageal reflux disease)    High cholesterol    History of kidney stones 04/10/2019   30m calculus in right ureter, noted on CT abd   Hydroureteronephrosis    Moderate, right,noted on CT abd   Meniere disease    Phlebitis    Bilateral lower legs   Seasonal allergies    Trigeminy    Ulcer of abdomen wall ([facility])    stomach ulcers   [place]    Surgical History: Past Surgical History:  Procedure Laterality Date   APPENDECTOMY     BOWEL RESECTION     CESAREAN SECTION     COLONOSCOPY  03/2019   EXTRACORPOREAL SHOCK WAVE LITHOTRIPSY Right 04/16/2019   Procedure: EXTRACORPOREAL  SHOCK WAVE LITHOTRIPSY (ESWL);  Surgeon: HArdis Hughs MD;  Location: [facility];  Service: Urology;  Laterality: Right;   EXTRACORPOREAL SHOCK WAVE LITHOTRIPSY Left 08/23/2022   Procedure: EXTRACORPOREAL SHOCK WAVE LITHOTRIPSY (ESWL);  Surgeon: SAbbie Sons MD;  Location: [facility] ORS;  Service: Urology;  Laterality: Left;   FOOT SURGERY Left    OVARIAN CYST REMOVAL     TONSILLECTOMY     UPPER GI ENDOSCOPY      Home Medications:  Allergies as of 08/31/2022       Reactions   Iron Sucrose    Feet/legs/hand/face swelling, nausea, headaches,    Remicade [infliximab] Shortness Of Breath   Entocort Ec [budesonide] Other (See Comments)   Lowers Magnesium level   Codeine Nausea Only   Esomeprazole Magnesium    Drops magnesium and potassium   Zolpidem    Other reaction(s): Confusion Other reaction(s): Other (See Comments) Sleep walking   Zolpidem Tartrate Other (See Comments)   Sleep walking   Cephalexin Rash   Clindamycin Nausea Only   Other reaction(s): Other (See Comments) Severe Nausea / diarrhea   Trazodone Palpitations        Medication List        Accurate as of August 31, 2022  3:39 PM. If you have any questions, ask your nurse or doctor.          STOP taking these medications  sulfamethoxazole-trimethoprim 800-160 MG tablet Commonly known as: BACTRIM DS Stopped by: Debroah Loop, PA-C       TAKE these medications    ALPRAZolam 0.5 MG tablet Commonly known as: XANAX Take by mouth.   Claritin 10 MG Caps Generic drug: Loratadine   cyanocobalamin 1000 MCG/ML injection Commonly known as: VITAMIN B12 every thirty (30) days.   famotidine 40 MG tablet Commonly known as: PEPCID Take by mouth.   ferrous sulfate 325 (65 FE) MG EC tablet Take 2 tablets by mouth in the morning and at bedtime. With food   ondansetron 4 MG disintegrating tablet Commonly known as: ZOFRAN-ODT Take 1 tablet (4 mg total) by mouth every 8 (eight) hours as  needed.   oxyCODONE 5 MG immediate release tablet Commonly known as: Roxicodone Take 1 tablet (5 mg total) by mouth every 8 (eight) hours as needed.   Potassium Chloride ER 20 MEQ Tbcr Take 1 tablet by mouth 2 (two) times daily.   rosuvastatin 5 MG tablet Commonly known as: CRESTOR Take 5 mg by mouth daily. 3 pm   simethicone 125 MG chewable tablet Commonly known as: MYLICON Take 2 tablets (250 mg) at 5:00 pm the day before your procedure. Take remaining 2 tablets (250 mg) at least 4 hours before your procedure time.   Stelara 90 MG/ML Sosy injection Generic drug: ustekinumab Inject into the skin.   tamsulosin 0.4 MG Caps capsule Commonly known as: FLOMAX Take 1 capsule (0.4 mg total) by mouth daily.   Tylenol 8 Hour Arthritis Pain 650 MG CR tablet Generic drug: acetaminophen        Allergies:  Allergies  Allergen Reactions   Iron Sucrose     Feet/legs/hand/face swelling, nausea, headaches,    Remicade [Infliximab] Shortness Of Breath   Entocort Ec [Budesonide] Other (See Comments)    Lowers Magnesium level   Codeine Nausea Only   Esomeprazole Magnesium     Drops magnesium and potassium    Zolpidem     Other reaction(s): Confusion Other reaction(s): Other (See Comments) Sleep walking   Zolpidem Tartrate Other (See Comments)    Sleep walking    Cephalexin Rash   Clindamycin Nausea Only    Other reaction(s): Other (See Comments) Severe Nausea / diarrhea   Trazodone Palpitations    Family History: Family History  Problem Relation Age of Onset   Breast cancer Mother    Thyroid disease Mother    Hypertension Father    Hyperlipidemia Father    Ulcers Father    Stomach cancer Sister    Diabetes Brother    Hyperlipidemia Brother    Hypertension Brother    Hyperlipidemia Brother    Hypertension Brother    Hyperlipidemia Brother    Hypertension Brother    Breast cancer Maternal Aunt    Lymphoma Maternal Aunt    Colon cancer Paternal Uncle    Stomach  cancer Paternal Uncle    Breast cancer Cousin    Breast cancer Cousin     Social History:   reports that she quit smoking about 13 years ago. Her smoking use included cigarettes. She has a 60.00 pack-year smoking history. She has never used smokeless tobacco. She reports current alcohol use. She reports that she does not use drugs.  Physical Exam: BP 116/77   Pulse 80   Ht 5' 3"  (1.6 m)   Wt 151 lb (68.5 kg)   BMI 26.75 kg/m   Constitutional:  Alert and oriented, no acute distress, nontoxic appearing HEENT: [place],  AT Cardiovascular: No clubbing, cyanosis, or edema Respiratory: Normal respiratory effort, no increased work of breathing Skin: No rashes, bruises or suspicious lesions Neurologic: Grossly intact, no focal deficits, moving all 4 extremities Psychiatric: Normal mood and affect  Laboratory Data: Results for orders placed or performed in visit on 08/31/22  CULTURE, URINE COMPREHENSIVE   Specimen: Urine   UR  Result Value Ref Range   Urine Culture, Comprehensive Preliminary report (A)    Organism ID, Bacteria Escherichia coli (A)    Organism ID, Bacteria Comment   Microscopic Examination   Urine  Result Value Ref Range   WBC, UA >30 (A) 0 - 5 /hpf   RBC, Urine 11-30 (A) 0 - 2 /hpf   Epithelial Cells (non renal) 0-10 0 - 10 /hpf   Bacteria, UA Many (A) None seen/Few  Urinalysis, Complete  Result Value Ref Range   Specific Gravity, UA 1.020 1.005 - 1.030   pH, UA 5.0 5.0 - 7.5   Color, UA Yellow Yellow   Appearance Ur Hazy (A) Clear   Leukocytes,UA 1+ (A) Negative   Protein,UA 1+ (A) Negative/Trace   Glucose, UA Negative Negative   Ketones, UA Trace (A) Negative   RBC, UA 2+ (A) Negative   Bilirubin, UA Negative Negative   Urobilinogen, Ur 0.2 0.2 - 1.0 mg/dL   Nitrite, UA Negative Negative   Microscopic Examination See below:    Assessment & Plan:   1. Gross hematuria Likely secondary to fragment passage s/p ESWL versus cystitis, see below.  Resolved.  No  further intervention indicated.  2. Bladder pain Her UA again appears suspicious today, but she is well-appearing, VSS in clinic today.  I suspect cystitis and have low concern for upper tract involvement at this time.  We will start empiric Cipro and repeat a urine culture today.  We discussed return precautions including fever, flank pain, nausea, or vomiting.  She expressed understanding. - Urinalysis, Complete - CULTURE, URINE COMPREHENSIVE - ciprofloxacin (CIPRO) 250 MG tablet; Take 1 tablet (250 mg total) by mouth 2 (two) times daily for 5 days.  Dispense: 10 tablet; Refill: 0   Return if symptoms worsen or fail to improve.  Debroah Loop, PA-C  [facility] [address], [place], [place] [address] [phone]

## 2022-08-31 NOTE — Telephone Encounter (Signed)
pt left v/m has blood in urine, had Litho w/Brandon last Thurs.  Can she be seen today?

## 2022-08-31 NOTE — Telephone Encounter (Signed)
Spoke with patient and she is having blood in her strainer, pt had litho 08/17/22 and now seeing blood. Pt having lower abd pain, no fever, she does have chills, no smelly urine, just darker with the blood. Added to schedule today at 3pm.

## 2022-09-03 LAB — CULTURE, URINE COMPREHENSIVE

## 2022-09-05 DIAGNOSIS — H25813 Combined forms of age-related cataract, bilateral: Secondary | ICD-10-CM | POA: Diagnosis not present

## 2022-09-05 DIAGNOSIS — H43813 Vitreous degeneration, bilateral: Secondary | ICD-10-CM | POA: Diagnosis not present

## 2022-09-05 DIAGNOSIS — E538 Deficiency of other specified B group vitamins: Secondary | ICD-10-CM | POA: Diagnosis not present

## 2022-09-05 DIAGNOSIS — H04123 Dry eye syndrome of bilateral lacrimal glands: Secondary | ICD-10-CM | POA: Diagnosis not present

## 2022-09-05 DIAGNOSIS — H5319 Other subjective visual disturbances: Secondary | ICD-10-CM | POA: Diagnosis not present

## 2022-09-10 DIAGNOSIS — K219 Gastro-esophageal reflux disease without esophagitis: Secondary | ICD-10-CM | POA: Diagnosis not present

## 2022-09-12 ENCOUNTER — Ambulatory Visit
Admission: RE | Admit: 2022-09-12 | Discharge: 2022-09-12 | Disposition: A | Discharge status: Home / Self Care | Payer: Medicare PPO | Attending: Physician Assistant | Admitting: Physician Assistant

## 2022-09-12 ENCOUNTER — Other Ambulatory Visit: Payer: Self-pay

## 2022-09-12 ENCOUNTER — Ambulatory Visit: Payer: Medicare PPO | Admitting: Physician Assistant

## 2022-09-12 ENCOUNTER — Ambulatory Visit
Admission: RE | Admit: 2022-09-12 | Discharge: 2022-09-12 | Disposition: A | Discharge status: Home / Self Care | Payer: Medicare PPO | Source: Ambulatory Visit | Attending: Physician Assistant | Admitting: Physician Assistant

## 2022-09-12 ENCOUNTER — Encounter: Payer: Self-pay | Admitting: Physician Assistant

## 2022-09-12 VITALS — BP 112/75 | HR 67 | Ht 63.0 in | Wt 150.0 lb

## 2022-09-12 DIAGNOSIS — N201 Calculus of ureter: Secondary | ICD-10-CM | POA: Diagnosis not present

## 2022-09-12 DIAGNOSIS — R310 Gross hematuria: Secondary | ICD-10-CM | POA: Diagnosis not present

## 2022-09-12 DIAGNOSIS — N2889 Other specified disorders of kidney and ureter: Secondary | ICD-10-CM | POA: Diagnosis not present

## 2022-09-12 DIAGNOSIS — Z87442 Personal history of urinary calculi: Secondary | ICD-10-CM | POA: Diagnosis not present

## 2022-09-12 MED ORDER — TAMSULOSIN HCL 0.4 MG PO CAPS: 0.4000 mg | ORAL_CAPSULE | Freq: Every day | ORAL | 1 refills | Status: DC

## 2022-09-12 NOTE — Progress Notes (Addendum)
09/12/2022 1:05 PM   [name] 1956-06-12 [id number]  CC: Chief Complaint  Patient presents with   Follow-up   HPI: Cheryl Ferguson is a 66 y.o. female with PMH Crohn's disease and nephrolithiasis who underwent ESWL with Dr. Bernardo Heater 20 days ago for management of a 6 mm left UPJ stone who presents today for postop follow-up.  Operative note describes fragmentation of the stone.  Today she reports she has passed multiple stone fragments, which she brings with her to clinic today for analysis.  She has occasional pelvic discomfort and then will pass a fragment.  She felt this discomfort most recently yesterday.  Overall, she feels well with no dysuria and has no acute concerns.  KUB today with interval fragmentation of her proximal left ureteral stone and migration of several fragments to the distal left ureter and a Steinstrasse orientation.  In-office UA today positive for 2+ blood, 2+ protein, and trace leukocytes; urine microscopy with 11-30 WBCs/HPF, 3-10 RBCs/HPF, and many bacteria.  PMH: Past Medical History:  Diagnosis Date   Anemia    Anxiety    Aortic atherosclerosis ([place]) 03/19/2019   Noted in CT abd   Arthritis    Crohn disease ([place])    GERD (gastroesophageal reflux disease)    High cholesterol    History of kidney stones 04/10/2019   73m calculus in right ureter, noted on CT abd   Hydroureteronephrosis    Moderate, right,noted on CT abd   Meniere disease    Phlebitis    Bilateral lower legs   Seasonal allergies    Trigeminy    Ulcer of abdomen wall ([facility])    stomach ulcers   [place] 2009    Surgical History: Past Surgical History:  Procedure Laterality Date   APPENDECTOMY     BOWEL RESECTION     CESAREAN SECTION     COLONOSCOPY  03/2019   EXTRACORPOREAL SHOCK WAVE LITHOTRIPSY Right 04/16/2019   Procedure: EXTRACORPOREAL SHOCK WAVE LITHOTRIPSY (ESWL);  Surgeon: HArdis Hughs MD;  [facility];  Service:  Urology;  Laterality: Right;   EXTRACORPOREAL SHOCK WAVE LITHOTRIPSY Left 08/23/2022   Procedure: EXTRACORPOREAL SHOCK WAVE LITHOTRIPSY (ESWL);  Surgeon: SAbbie Sons MD;  Location: [facility];  Service: Urology;  Laterality: Left;   FOOT SURGERY Left    OVARIAN CYST REMOVAL     TONSILLECTOMY     UPPER GI ENDOSCOPY      Home Medications:  Allergies as of 09/12/2022       Reactions   Iron Sucrose    Feet/legs/hand/face swelling, nausea, headaches,    Remicade [infliximab] Shortness Of Breath   Entocort Ec [budesonide] Other (See Comments)   Lowers Magnesium level   Codeine Nausea Only   Esomeprazole Magnesium    Drops magnesium and potassium   Zolpidem    Other reaction(s): Confusion Other reaction(s): Other (See Comments) Sleep walking   Zolpidem Tartrate Other (See Comments)   Sleep walking   Cephalexin Rash   Clindamycin Nausea Only   Other reaction(s): Other (See Comments) Severe Nausea / diarrhea   Trazodone Palpitations        Medication List        Accurate as of September 12, 2022  1:05 PM. If you have any questions, ask your nurse or doctor.          STOP taking these medications    ondansetron 4 MG disintegrating tablet Commonly known as: ZOFRAN-ODT Stopped by: SDebroah Loop PA-C  oxyCODONE 5 MG immediate release tablet Commonly known as: Roxicodone Stopped by: Debroah Loop, PA-C       TAKE these medications    ALPRAZolam 0.5 MG tablet Commonly known as: XANAX Take by mouth.   Claritin 10 MG Caps Generic drug: Loratadine   cyanocobalamin 1000 MCG/ML injection Commonly known as: VITAMIN B12 every thirty (30) days.   famotidine 40 MG tablet Commonly known as: PEPCID Take by mouth.   ferrous sulfate 325 (65 FE) MG EC tablet Take 2 tablets by mouth in the morning and at bedtime. With food   Potassium Chloride ER 20 MEQ Tbcr Take 1 tablet by mouth 2 (two) times daily.   rosuvastatin 5 MG tablet Commonly known as:  CRESTOR Take 5 mg by mouth daily. 3 pm   simethicone 125 MG chewable tablet Commonly known as: MYLICON Take 2 tablets (250 mg) at 5:00 pm the day before your procedure. Take remaining 2 tablets (250 mg) at least 4 hours before your procedure time.   Stelara 90 MG/ML Sosy injection Generic drug: ustekinumab Inject into the skin.   tamsulosin 0.4 MG Caps capsule Commonly known as: FLOMAX Take 1 capsule (0.4 mg total) by mouth daily.   Tylenol 8 Hour Arthritis Pain 650 MG CR tablet Generic drug: acetaminophen        Allergies:  Allergies  Allergen Reactions   Iron Sucrose     Feet/legs/hand/face swelling, nausea, headaches,    Remicade [Infliximab] Shortness Of Breath   Entocort Ec [Budesonide] Other (See Comments)    Lowers Magnesium level   Codeine Nausea Only   Esomeprazole Magnesium     Drops magnesium and potassium    Zolpidem     Other reaction(s): Confusion Other reaction(s): Other (See Comments) Sleep walking   Zolpidem Tartrate Other (See Comments)    Sleep walking    Cephalexin Rash   Clindamycin Nausea Only    Other reaction(s): Other (See Comments) Severe Nausea / diarrhea   Trazodone Palpitations    Family History: Family History  Problem Relation Age of Onset   Breast cancer Mother    Thyroid disease Mother    Hypertension Father    Hyperlipidemia Father    Ulcers Father    Stomach cancer Sister    Diabetes Brother    Hyperlipidemia Brother    Hypertension Brother    Hyperlipidemia Brother    Hypertension Brother    Hyperlipidemia Brother    Hypertension Brother    Breast cancer Maternal Aunt    Lymphoma Maternal Aunt    Colon cancer Paternal Uncle    Stomach cancer Paternal Uncle    Breast cancer Cousin    Breast cancer Cousin     Social History:   reports that she quit smoking about 13 years ago. Her smoking use included cigarettes. She has a 60.00 pack-year smoking history. She has never used smokeless tobacco. She reports current  alcohol use. She reports that she does not use drugs.  Physical Exam: BP 112/75   Pulse 67   Ht 5' 3"  (1.6 m)   Wt 150 lb (68 kg)   BMI 26.57 kg/m   Constitutional:  Alert and oriented, no acute distress, nontoxic appearing HEENT: [place], AT Cardiovascular: No clubbing, cyanosis, or edema Respiratory: Normal respiratory effort, no increased work of breathing Skin: No rashes, bruises or suspicious lesions Neurologic: Grossly intact, no focal deficits, moving all 4 extremities Psychiatric: Normal mood and affect  Laboratory Data: Results for orders placed or performed in visit on  09/12/22  Microscopic Examination   Urine  Result Value Ref Range   WBC, UA 11-30 (A) 0 - 5 /hpf   RBC, Urine 3-10 (A) 0 - 2 /hpf   Epithelial Cells (non renal) 0-10 0 - 10 /hpf   Mucus, UA Present (A) Not Estab.   Bacteria, UA Many (A) None seen/Few  Urinalysis, Complete  Result Value Ref Range   Specific Gravity, UA 1.030 1.005 - 1.030   pH, UA 5.0 5.0 - 7.5   Color, UA Yellow Yellow   Appearance Ur Clear Clear   Leukocytes,UA Trace (A) Negative   Protein,UA 2+ (A) Negative/Trace   Glucose, UA Negative Negative   Ketones, UA Negative Negative   RBC, UA 2+ (A) Negative   Bilirubin, UA Negative Negative   Urobilinogen, Ur 0.2 0.2 - 1.0 mg/dL   Nitrite, UA Negative Negative   Microscopic Examination See below:    Pertinent Imaging: KUB, 09/12/2022: CLINICAL DATA:  Left ureteral stone   EXAM: ABDOMEN - 1 VIEW   COMPARISON:  KUB August 23, 2022. CT scan of the abdomen and pelvis August 15, 2022.   FINDINGS: No renal stones are identified.   No right ureteral stones noted.   Four calcifications are identified in the region of the distal left ureter, new in the interval. In total, these for calcifications span 19 mm. The largest of these calcifications measures 4 or 5 mm. The previously identified stone in the left renal pelvis is no longer visualized, migrated distally.   Other  calcifications in the left pelvis are stable consistent with phleboliths.   No other bony or soft tissue abnormalities identified.   IMPRESSION: 1. Four calcifications are identified in the region of the distal left ureter, new in the interval, spanning 19 mm. The largest measures between 4 and 5 mm. 2. The previously identified stone in the left renal pelvis is no longer visualized, fragmented and migrated distally.     Electronically Signed   By: Dorise Bullion III M.D.   On: 09/14/2022 08:15  I personally reviewed the images referenced above and note interval fragmentation of the proximal left ureteral stone.  There are several residual fragments at the distal left ureter and a Steinstrasse orientation.  Assessment & Plan:   1. Left ureteral stone She has passed multiple fragments, we will send these for analysis today.  She does have some residual fragments in the distal left ureter on KUB today, however she is minimally symptomatic.  We discussed giving her additional time to pass these pieces and she is in agreement with this plan.  Refilling Flomax.  She may stop straining her urine.  I will plan to see her back in 3 more weeks with another KUB prior.  We discussed that if she has not passed any residual pieces at that point, we may consider a repeat shockwave procedure to break up her Steinstrasse.  Her UA again appears infected today, though she is not symptomatic.  We will repeat a urine culture today and treat as indicated.  We discussed return precautions including fevers, severe pain, and dysuria. - Urinalysis, Complete - CULTURE, URINE COMPREHENSIVE - Calculi, with Photograph (to Clinical Lab) - DG Abd 1 View; Future - tamsulosin (FLOMAX) 0.4 MG CAPS capsule; Take 1 capsule (0.4 mg total) by mouth daily.  Dispense: 30 capsule; Refill: 1  Return in about 3 weeks (around 10/03/2022) for Stone f/u with UA + KUB prior, will call with urine culture results.  Debroah Loop,  PA-C  [facility] [address], [place], [place] [address] [phone]

## 2022-09-13 LAB — URINALYSIS, COMPLETE
Bilirubin, UA: NEGATIVE
Glucose, UA: NEGATIVE
Ketones, UA: NEGATIVE
Nitrite, UA: NEGATIVE
Specific Gravity, UA: 1.03 (ref 1.005–1.030)
Urobilinogen, Ur: 0.2 mg/dL (ref 0.2–1.0)
pH, UA: 5 (ref 5.0–7.5)

## 2022-09-13 LAB — MICROSCOPIC EXAMINATION

## 2022-09-16 LAB — CULTURE, URINE COMPREHENSIVE

## 2022-09-18 ENCOUNTER — Telehealth: Payer: Self-pay | Admitting: *Deleted

## 2022-09-18 MED ORDER — TRIMETHOPRIM 100 MG PO TABS: 100.0000 mg | ORAL_TABLET | Freq: Every day | ORAL | 0 refills | Status: AC

## 2022-09-18 MED ORDER — SULFAMETHOXAZOLE-TRIMETHOPRIM 800-160 MG PO TABS: 1.0000 | ORAL_TABLET | Freq: Two times a day (BID) | ORAL | 0 refills | Status: AC

## 2022-09-18 NOTE — Telephone Encounter (Signed)
-----   Message from [place], [place] sent at 09/17/2022  4:40 PM EDT ----- Her urine culture grew a low volume of bacteria, typically less than we consider indicative of true infection, however given her residual stone fragments I recommend that we treat her regardless. Let's do Bactrim DS BID x5 days and then put her on trimethoprim 145m daily x2 weeks to last at least until I see her next.

## 2022-09-18 NOTE — Telephone Encounter (Signed)
Spoke with patient and advised results rx sent to pharmacy by e-script  

## 2022-09-19 LAB — CALCULI, WITH PHOTOGRAPH (CLINICAL LAB)
Calcium Oxalate Dihydrate: 30 %
Calcium Oxalate Monohydrate: 70 %
Weight Calculi: 38 mg

## 2022-09-27 ENCOUNTER — Ambulatory Visit: Admit: 2022-09-27 | Discharge: 2022-09-28 | Payer: MEDICARE | Attending: Gastroenterology | Primary: Gastroenterology

## 2022-09-27 DIAGNOSIS — K50919 Crohn's disease, unspecified, with unspecified complications: Principal | ICD-10-CM

## 2022-09-27 MED ORDER — HYOSCYAMINE 0.125 MG SUBLINGUAL TABLET
ORAL_TABLET | SUBLINGUAL | 3 refills | 5 days | Status: CP | PRN
Start: 2022-09-27 — End: ?

## 2022-10-01 ENCOUNTER — Ambulatory Visit: Payer: Medicare PPO | Admitting: Physician Assistant

## 2022-10-01 ENCOUNTER — Telehealth: Payer: Self-pay

## 2022-10-01 NOTE — Telephone Encounter (Signed)
[facility]- Need to r/s 2pm appt. Sam out sick.   CM

## 2022-10-04 ENCOUNTER — Other Ambulatory Visit: Payer: Self-pay | Admitting: Physician Assistant

## 2022-10-04 DIAGNOSIS — N201 Calculus of ureter: Secondary | ICD-10-CM

## 2022-10-05 DIAGNOSIS — E538 Deficiency of other specified B group vitamins: Secondary | ICD-10-CM | POA: Diagnosis not present

## 2022-10-08 ENCOUNTER — Ambulatory Visit
Admission: RE | Admit: 2022-10-08 | Discharge: 2022-10-08 | Disposition: A | Discharge status: Home / Self Care | Payer: Medicare PPO | Source: Ambulatory Visit | Attending: Family Medicine | Admitting: Family Medicine

## 2022-10-08 DIAGNOSIS — M8589 Other specified disorders of bone density and structure, multiple sites: Secondary | ICD-10-CM | POA: Diagnosis not present

## 2022-10-08 DIAGNOSIS — Z13820 Encounter for screening for osteoporosis: Secondary | ICD-10-CM

## 2022-10-08 DIAGNOSIS — Z780 Asymptomatic menopausal state: Secondary | ICD-10-CM | POA: Diagnosis not present

## 2022-10-09 ENCOUNTER — Ambulatory Visit
Admission: RE | Admit: 2022-10-09 | Discharge: 2022-10-09 | Disposition: A | Discharge status: Home / Self Care | Payer: Medicare PPO | Attending: Physician Assistant | Admitting: Physician Assistant

## 2022-10-09 ENCOUNTER — Ambulatory Visit
Admission: RE | Admit: 2022-10-09 | Discharge: 2022-10-09 | Disposition: A | Discharge status: Home / Self Care | Payer: Medicare PPO | Source: Ambulatory Visit | Attending: Physician Assistant | Admitting: Physician Assistant

## 2022-10-09 DIAGNOSIS — N201 Calculus of ureter: Secondary | ICD-10-CM | POA: Insufficient documentation

## 2022-10-09 DIAGNOSIS — I878 Other specified disorders of veins: Secondary | ICD-10-CM | POA: Diagnosis not present

## 2022-10-10 ENCOUNTER — Ambulatory Visit: Payer: Medicare PPO | Admitting: Physician Assistant

## 2022-10-10 VITALS — BP 117/74 | HR 76 | Ht 63.0 in | Wt 152.0 lb

## 2022-10-10 DIAGNOSIS — R3129 Other microscopic hematuria: Secondary | ICD-10-CM

## 2022-10-10 DIAGNOSIS — N201 Calculus of ureter: Secondary | ICD-10-CM | POA: Diagnosis not present

## 2022-10-10 DIAGNOSIS — R310 Gross hematuria: Secondary | ICD-10-CM | POA: Diagnosis not present

## 2022-10-10 LAB — URINALYSIS, COMPLETE
Bilirubin, UA: NEGATIVE
Glucose, UA: NEGATIVE
Ketones, UA: NEGATIVE
Leukocytes,UA: NEGATIVE
Nitrite, UA: NEGATIVE
Specific Gravity, UA: 1.025 (ref 1.005–1.030)
Urobilinogen, Ur: 0.2 mg/dL (ref 0.2–1.0)
pH, UA: 5.5 (ref 5.0–7.5)

## 2022-10-10 LAB — MICROSCOPIC EXAMINATION

## 2022-10-10 NOTE — Progress Notes (Signed)
10/10/2022 5:31 PM   Cheryl Ferguson Cheryl Ferguson [birth year] [id number]  CC: Chief Complaint  Patient presents with   Follow-up   HPI: Cheryl Ferguson is a 66 y.o. female with PMH Crohn's disease and nephrolithiasis who underwent ESWL with Dr. Bernardo Heater on 08/23/2022 for management of a 6 mm left UPJ stone and was found to have a Steinstrasse of residual fragments at the distal left ureter on initial follow-up who presents today for further follow-up.   Today she reports her pelvic discomfort has completely resolved and she feels well today without acute concerns.  She has not noticed any additional stone fragments pass since I last saw her in clinic 1 month ago.  She denies dysuria or gross hematuria.  Repeat KUB yesterday with possible interval resolution of her distal left ureteral Steinstrasse, however there appears to be bowel gas immediately overlying the anticipated course of the distal ureter which is obstructing the view.  Her stone analysis resulted with 70% calcium oxalate monohydrate, 30% calcium oxalate dihydrate.  She reports poor p.o. hydration and does not enjoy drinking plain water.  Her PCP is planning to start her on 1200 mg of calcium supplementation daily.  In-office UA today positive for 1+ blood and 2+ protein; urine microscopy with 3-10 RBCs/HPF.  PMH: Past Medical History:  Diagnosis Date   Anemia    Anxiety    Aortic atherosclerosis ([place]) 03/19/2019   Noted in CT abd   Arthritis    Crohn disease ([place])    GERD (gastroesophageal reflux disease)    High cholesterol    History of kidney stones 04/10/2019   36m calculus in right ureter, noted on CT abd   Hydroureteronephrosis    Moderate, right,noted on CT abd   Meniere disease    Phlebitis    Bilateral lower legs   Seasonal allergies    Trigeminy    Ulcer of abdomen wall (H[facility])    stomach ulcers   [place] tachycardia ([place] 2009    Surgical History: Past Surgical History:  Procedure Laterality Date    APPENDECTOMY     BOWEL RESECTION     CESAREAN SECTION     COLONOSCOPY  03/2019   EXTRACORPOREAL SHOCK WAVE LITHOTRIPSY Right 04/16/2019   Procedure: EXTRACORPOREAL SHOCK WAVE LITHOTRIPSY (ESWL);  Surgeon: HArdis Hughs MD;  Location: [facility];  Service: Urology;  Laterality: Right;   EXTRACORPOREAL SHOCK WAVE LITHOTRIPSY Left 08/23/2022   Procedure: EXTRACORPOREAL SHOCK WAVE LITHOTRIPSY (ESWL);  Surgeon: SAbbie Sons MD;  Location: [facility];  Service: Urology;  Laterality: Left;   FOOT SURGERY Left    OVARIAN CYST REMOVAL     TONSILLECTOMY     UPPER GI ENDOSCOPY      Home Medications:  Allergies as of 10/10/2022       Reactions   Iron Sucrose    Feet/legs/hand/face swelling, nausea, headaches,    Remicade [infliximab] Shortness Of Breath   Entocort Ec [budesonide] Other (See Comments)   Lowers Magnesium level   Codeine Nausea Only   Esomeprazole Magnesium    Drops magnesium and potassium   Zolpidem    Other reaction(s): Confusion Other reaction(s): Other (See Comments) Sleep walking   Zolpidem Tartrate Other (See Comments)   Sleep walking   Cephalexin Rash   Clindamycin Nausea Only   Other reaction(s): Other (See Comments) Severe Nausea / diarrhea   Trazodone Palpitations        Medication List        Accurate as of October 10, 2022  5:31 PM. If you have any questions, ask your nurse or doctor.          STOP taking these medications    tamsulosin 0.4 MG Caps capsule Commonly known as: FLOMAX       TAKE these medications    ALPRAZolam 0.5 MG tablet Commonly known as: XANAX Take by mouth.   Claritin 10 MG Caps Generic drug: Loratadine   cyanocobalamin 1000 MCG/ML injection Commonly known as: VITAMIN B12 every thirty (30) days.   famotidine 40 MG tablet Commonly known as: PEPCID Take by mouth.   ferrous sulfate 325 (65 FE) MG EC tablet Take 2 tablets by mouth in the morning and at bedtime. With food   Potassium Chloride ER 20 MEQ  Tbcr Take 1 tablet by mouth 2 (two) times daily.   rosuvastatin 5 MG tablet Commonly known as: CRESTOR Take 5 mg by mouth daily. 3 pm   simethicone 125 MG chewable tablet Commonly known as: MYLICON Take 2 tablets (250 mg) at 5:00 pm the day before your procedure. Take remaining 2 tablets (250 mg) at least 4 hours before your procedure time.   Stelara 90 MG/ML Sosy injection Generic drug: ustekinumab Inject into the skin.   Tylenol 8 Hour Arthritis Pain 650 MG CR tablet Generic drug: acetaminophen        Allergies:  Allergies  Allergen Reactions   Iron Sucrose     Feet/legs/hand/face swelling, nausea, headaches,    Remicade [Infliximab] Shortness Of Breath   Entocort Ec [Budesonide] Other (See Comments)    Lowers Magnesium level   Codeine Nausea Only   Esomeprazole Magnesium     Drops magnesium and potassium    Zolpidem     Other reaction(s): Confusion Other reaction(s): Other (See Comments) Sleep walking   Zolpidem Tartrate Other (See Comments)    Sleep walking    Cephalexin Rash   Clindamycin Nausea Only    Other reaction(s): Other (See Comments) Severe Nausea / diarrhea   Trazodone Palpitations    Family History: Family History  Problem Relation Age of Onset   Breast cancer Mother    Thyroid disease Mother    Hypertension Father    Hyperlipidemia Father    Ulcers Father    Stomach cancer Sister    Diabetes Brother    Hyperlipidemia Brother    Hypertension Brother    Hyperlipidemia Brother    Hypertension Brother    Hyperlipidemia Brother    Hypertension Brother    Breast cancer Maternal Aunt    Lymphoma Maternal Aunt    Colon cancer Paternal Uncle    Stomach cancer Paternal Uncle    Breast cancer Cousin    Breast cancer Cousin     Social History:   reports that she quit smoking about 13 years ago. Her smoking use included cigarettes. She has a 60.00 pack-year smoking history. She has never used smokeless tobacco. She reports current alcohol  use. She reports that she does not use drugs.  Physical Exam: BP 117/74   Pulse 76   Ht 5' 3"  (1.6 m)   Wt 152 lb (68.9 kg)   BMI 26.93 kg/m   Constitutional:  Alert and oriented, no acute distress, nontoxic appearing HEENT: Home, AT Cardiovascular: No clubbing, cyanosis, or edema Respiratory: Normal respiratory effort, no increased work of breathing Skin: No rashes, bruises or suspicious lesions Neurologic: Grossly intact, no focal deficits, moving all 4 extremities Psychiatric: Normal mood and affect  Laboratory Data: Results for orders placed or  performed in visit on 10/10/22  Microscopic Examination   Urine  Result Value Ref Range   WBC, UA 0-5 0 - 5 /hpf   RBC, Urine 3-10 (A) 0 - 2 /hpf   Epithelial Cells (non renal) 0-10 0 - 10 /hpf   Mucus, UA Present (A) Not Estab.   Bacteria, UA Few None seen/Few  Urinalysis, Complete  Result Value Ref Range   Specific Gravity, UA 1.025 1.005 - 1.030   pH, UA 5.5 5.0 - 7.5   Color, UA Yellow Yellow   Appearance Ur Clear Clear   Leukocytes,UA Negative Negative   Protein,UA 2+ (A) Negative/Trace   Glucose, UA Negative Negative   Ketones, UA Negative Negative   RBC, UA 1+ (A) Negative   Bilirubin, UA Negative Negative   Urobilinogen, Ur 0.2 0.2 - 1.0 mg/dL   Nitrite, UA Negative Negative   Microscopic Examination See below:    Pertinent Imaging: KUB, 10/09/2022: CLINICAL DATA:  left ureteral stone s/p ESWL   EXAM: ABDOMEN - 1 VIEW   COMPARISON:  09/12/2022   FINDINGS: Previously noted serial calculi within the distal left ureter have largely resolved though a residual 2 mm faintly calcified density persists within the distal left ureter. Multiple phleboliths noted within the left hemipelvis. No additional renal or ureteral calculi. Surgical anastomotic staple line and surgical clips noted within the right mid abdomen. Normal abdominal gas pattern. No acute bone abnormality.   IMPRESSION: 1. Near complete resolution of  previously noted distal left ureteral calculi. A residual 2 mm distal left ureteral calculus persists.     Electronically Signed   By: Fidela Salisbury M.D.   On: 10/11/2022 20:06  I personally reviewed the images referenced above and note bowel gas overlying the anticipated path of the distal left ureter, which obscures the view of her previously identified Steinstrasse.  Assessment & Plan:   1. Left ureteral stone Her symptoms have resolved, however she never saw additional stone fragments pass and she continues to have microscopic hematuria on UA.  Visualization on her repeat KUB is suboptimal and I cannot rule out persistence of her residual stone fragments.  I recommend additional imaging at this time, however given that she is asymptomatic I do not think CT scan is warranted.  We discussed a repeat KUB versus renal ultrasound and in shared decision making we elected to proceed with a renal ultrasound.  We discussed that left hydronephrosis or a diminished left ureteral jet would be consistent with residual distal left ureteral fragments that would require treatment.  She is in agreement with this plan.  We will determine a plan regarding repeat urinalysis for monitoring of resolution of microscopic hematuria based on her renal ultrasound results.  We also discussed return precautions including return of her pain, dysuria, or gross hematuria.  We also discussed calcium oxalate stone invention recommendations today including increasing p.o. hydration, decreasing dietary oxalate, maintaining moderate dietary calcium, decreasing dietary sodium, and increasing dietary citrate.  Written and verbal resources provided today. - Urinalysis, Complete - US RENAL; Future  Return for Will call with results.  Debroah Loop, PA-C  [facility] [address] [place], [place] [address] [phone]

## 2022-10-16 ENCOUNTER — Encounter
Admit: 2022-10-16 | Discharge: 2022-10-16 | Payer: MEDICARE | Attending: Student in an Organized Health Care Education/Training Program | Primary: Student in an Organized Health Care Education/Training Program

## 2022-10-16 ENCOUNTER — Ambulatory Visit: Admit: 2022-10-16 | Discharge: 2022-10-16 | Payer: MEDICARE

## 2022-10-16 DIAGNOSIS — Z980 Intestinal bypass and anastomosis status: Secondary | ICD-10-CM | POA: Diagnosis not present

## 2022-10-16 DIAGNOSIS — K5080 Crohn's disease of both small and large intestine without complications: Secondary | ICD-10-CM | POA: Diagnosis not present

## 2022-10-16 DIAGNOSIS — J439 Emphysema, unspecified: Secondary | ICD-10-CM | POA: Diagnosis not present

## 2022-10-16 DIAGNOSIS — K648 Other hemorrhoids: Secondary | ICD-10-CM | POA: Diagnosis not present

## 2022-10-16 DIAGNOSIS — Z881 Allergy status to other antibiotic agents status: Secondary | ICD-10-CM | POA: Diagnosis not present

## 2022-10-16 DIAGNOSIS — Z885 Allergy status to narcotic agent status: Secondary | ICD-10-CM | POA: Diagnosis not present

## 2022-10-16 DIAGNOSIS — R0602 Shortness of breath: Secondary | ICD-10-CM | POA: Diagnosis not present

## 2022-10-16 DIAGNOSIS — K5000 Crohn's disease of small intestine without complications: Secondary | ICD-10-CM | POA: Diagnosis not present

## 2022-10-16 DIAGNOSIS — K222 Esophageal obstruction: Secondary | ICD-10-CM | POA: Diagnosis not present

## 2022-10-16 DIAGNOSIS — K2100 Gastro-esophageal reflux disease with esophagitis, without bleeding: Secondary | ICD-10-CM | POA: Diagnosis not present

## 2022-10-16 DIAGNOSIS — D649 Anemia, unspecified: Secondary | ICD-10-CM | POA: Diagnosis not present

## 2022-10-22 DIAGNOSIS — K5090 Crohn's disease, unspecified, without complications: Secondary | ICD-10-CM | POA: Diagnosis not present

## 2022-10-22 DIAGNOSIS — E538 Deficiency of other specified B group vitamins: Secondary | ICD-10-CM | POA: Diagnosis not present

## 2022-10-22 DIAGNOSIS — J432 Centrilobular emphysema: Secondary | ICD-10-CM | POA: Diagnosis not present

## 2022-10-22 DIAGNOSIS — Z23 Encounter for immunization: Secondary | ICD-10-CM | POA: Diagnosis not present

## 2022-10-22 DIAGNOSIS — I700 Atherosclerosis of aorta: Secondary | ICD-10-CM | POA: Diagnosis not present

## 2022-10-22 DIAGNOSIS — D509 Iron deficiency anemia, unspecified: Secondary | ICD-10-CM | POA: Diagnosis not present

## 2022-10-22 DIAGNOSIS — E782 Mixed hyperlipidemia: Secondary | ICD-10-CM | POA: Diagnosis not present

## 2022-10-22 DIAGNOSIS — E876 Hypokalemia: Secondary | ICD-10-CM | POA: Diagnosis not present

## 2022-10-22 DIAGNOSIS — I10 Essential (primary) hypertension: Secondary | ICD-10-CM | POA: Diagnosis not present

## 2022-10-23 ENCOUNTER — Ambulatory Visit: Payer: Medicare PPO

## 2022-10-23 ENCOUNTER — Ambulatory Visit
Admission: RE | Admit: 2022-10-23 | Discharge: 2022-10-23 | Disposition: A | Discharge status: Home / Self Care | Payer: Medicare PPO | Source: Ambulatory Visit | Attending: Physician Assistant | Admitting: Physician Assistant

## 2022-10-23 DIAGNOSIS — N201 Calculus of ureter: Secondary | ICD-10-CM | POA: Diagnosis not present

## 2022-10-23 DIAGNOSIS — N202 Calculus of kidney with calculus of ureter: Secondary | ICD-10-CM | POA: Diagnosis not present

## 2022-10-30 ENCOUNTER — Other Ambulatory Visit: Payer: Self-pay | Admitting: *Deleted

## 2022-10-30 DIAGNOSIS — K50919 Crohn's disease, unspecified, with unspecified complications: Principal | ICD-10-CM

## 2022-10-30 DIAGNOSIS — R310 Gross hematuria: Secondary | ICD-10-CM

## 2022-11-05 DIAGNOSIS — E538 Deficiency of other specified B group vitamins: Secondary | ICD-10-CM | POA: Diagnosis not present

## 2022-11-06 ENCOUNTER — Other Ambulatory Visit: Payer: Medicare PPO

## 2022-11-06 DIAGNOSIS — R310 Gross hematuria: Secondary | ICD-10-CM | POA: Diagnosis not present

## 2022-11-06 LAB — URINALYSIS, COMPLETE
Bilirubin, UA: NEGATIVE
Glucose, UA: NEGATIVE
Ketones, UA: NEGATIVE
Leukocytes,UA: NEGATIVE
Nitrite, UA: NEGATIVE
Specific Gravity, UA: 1.025 (ref 1.005–1.030)
Urobilinogen, Ur: 0.2 mg/dL (ref 0.2–1.0)
pH, UA: 5 (ref 5.0–7.5)

## 2022-11-06 LAB — MICROSCOPIC EXAMINATION

## 2022-11-28 NOTE — Progress Notes (Signed)
[facility]  [address] [place],  [place]  [address] [phone]  Clinic Day:  11/29/2022  Referring physician: Gaynelle Arabian, MD  HISTORY OF PRESENT ILLNESS:  The patient is a 66 y.o. female who I recently began seeing for iron deficiency anemia.  Due to her having an allergic reaction to IV iron, the patient has been taking oral iron daily to ensure an adequate fortification of her iron stores.  She currently takes 2 iron pills on a daily basis.  Of note, she has Crohn's disease, which has led to multiple bowel surgeries.  Fortunately, the patient has not had a flareup of her Crohn's disease since December 2021.  She comes in today to reassess her iron and hemoglobin levels.  Since her last visit, the patient has been doing fairly well.  She denies having any overt forms of blood loss since her last visit which concern her for recurrent iron deficiency anemia.  PHYSICAL EXAM:  Blood pressure 127/72, pulse 75, temperature (!) 97.5 F (36.4 C), resp. rate 14, height 5' 3"  (1.6 m), weight 154 lb 14.4 oz (70.3 kg), SpO2 98 %. Wt Readings from Last 3 Encounters:  11/29/22 154 lb 14.4 oz (70.3 kg)  10/10/22 152 lb (68.9 kg)  09/12/22 150 lb (68 kg)   Body mass index is 27.44 kg/m. Performance status (ECOG): 0 - Asymptomatic Physical Exam Constitutional:      Appearance: Normal appearance. She is not ill-appearing.  HENT:     Mouth/Throat:     Mouth: Mucous membranes are moist.     Pharynx: Oropharynx is clear. No oropharyngeal exudate or posterior oropharyngeal erythema.  Cardiovascular:     Rate and Rhythm: Normal rate and regular rhythm.     Heart sounds: No murmur heard.    No friction rub. No gallop.  Pulmonary:     Effort: Pulmonary effort is normal. No respiratory distress.     Breath sounds: Normal breath sounds. No wheezing, rhonchi or rales.  Abdominal:     General: Bowel sounds are normal. There is no distension.     Palpations:  Abdomen is soft. There is no mass.     Tenderness: There is no abdominal tenderness.  Musculoskeletal:        General: No swelling.     Right lower leg: No edema.     Left lower leg: No edema.  Lymphadenopathy:     Cervical: No cervical adenopathy.     Upper Body:     Right upper body: No supraclavicular or axillary adenopathy.     Left upper body: No supraclavicular or axillary adenopathy.     Lower Body: No right inguinal adenopathy. No left inguinal adenopathy.  Skin:    General: Skin is warm.     Coloration: Skin is not jaundiced.     Findings: No lesion or rash.  Neurological:     General: No focal deficit present.     Mental Status: She is alert and oriented to person, place, and time. Mental status is at baseline.  Psychiatric:        Mood and Affect: Mood normal.        Behavior: Behavior normal.        Thought Content: Thought content normal.   LABS:    Latest Reference Range & Units 11/29/22 09:36  Iron 28 - 170 ug/dL 87  UIBC ug/dL 360  TIBC 250 - 450 ug/dL 447  Saturation Ratios 10.4 - 31.8 % 20  Ferritin 11 - 307 ng/mL 82    ASSESSMENT & PLAN:  A 66 y.o. female with iron deficiency anemia.  I am pleased as her iron pills are keeping her hemoglobin and iron parameters at suitable levels.  Based upon this, she knows to continue taking her oral iron on a daily basis.  Clinically, she appears to be doing well.  I will see her back in 6 months for repeat clinical assessment.  The patient understands all the plans discussed today and is in agreement with them.  Jarell Mcewen Macarthur Critchley, MD

## 2022-11-29 ENCOUNTER — Inpatient Hospital Stay: Payer: Medicare PPO | Attending: Oncology | Admitting: Oncology

## 2022-11-29 ENCOUNTER — Inpatient Hospital Stay: Payer: Medicare PPO

## 2022-11-29 VITALS — BP 127/72 | HR 75 | Temp 97.5°F | Resp 14 | Ht 63.0 in | Wt 154.9 lb

## 2022-11-29 DIAGNOSIS — D508 Other iron deficiency anemias: Secondary | ICD-10-CM | POA: Diagnosis not present

## 2022-11-29 DIAGNOSIS — D509 Iron deficiency anemia, unspecified: Secondary | ICD-10-CM | POA: Diagnosis not present

## 2022-11-29 DIAGNOSIS — D649 Anemia, unspecified: Secondary | ICD-10-CM | POA: Diagnosis not present

## 2022-11-29 DIAGNOSIS — K5090 Crohn's disease, unspecified, without complications: Secondary | ICD-10-CM | POA: Diagnosis not present

## 2022-11-29 DIAGNOSIS — R319 Hematuria, unspecified: Secondary | ICD-10-CM | POA: Diagnosis not present

## 2022-11-29 DIAGNOSIS — D500 Iron deficiency anemia secondary to blood loss (chronic): Secondary | ICD-10-CM

## 2022-11-29 LAB — CBC AND DIFFERENTIAL
HCT: 37 (ref 36–46)
Hemoglobin: 12.2 (ref 12.0–16.0)
Neutrophils Absolute: 3.72
Platelets: 242 10*3/uL (ref 150–400)
WBC: 6.3

## 2022-11-29 LAB — IRON AND TIBC
Iron: 87 ug/dL (ref 28–170)
Saturation Ratios: 20 % (ref 10.4–31.8)
TIBC: 447 ug/dL (ref 250–450)
UIBC: 360 ug/dL

## 2022-11-29 LAB — FERRITIN: Ferritin: 82 ng/mL (ref 11–307)

## 2022-11-29 LAB — CBC: RBC: 3.82 — AB (ref 3.87–5.11)

## 2022-12-05 DIAGNOSIS — E538 Deficiency of other specified B group vitamins: Secondary | ICD-10-CM | POA: Diagnosis not present

## 2022-12-10 ENCOUNTER — Other Ambulatory Visit: Payer: Self-pay | Admitting: Oncology

## 2022-12-10 DIAGNOSIS — D500 Iron deficiency anemia secondary to blood loss (chronic): Secondary | ICD-10-CM

## 2022-12-18 DIAGNOSIS — K638219 Small intestinal bacterial overgrowth (SIBO): Principal | ICD-10-CM

## 2022-12-18 MED ORDER — RIFAXIMIN 550 MG TABLET
ORAL_TABLET | Freq: Two times a day (BID) | ORAL | 0 refills | 10 days | Status: CP
Start: 2022-12-18 — End: 2022-12-28

## 2023-01-07 DIAGNOSIS — E538 Deficiency of other specified B group vitamins: Secondary | ICD-10-CM | POA: Diagnosis not present

## 2023-02-07 DIAGNOSIS — E538 Deficiency of other specified B group vitamins: Secondary | ICD-10-CM | POA: Diagnosis not present

## 2023-02-26 DIAGNOSIS — M25552 Pain in left hip: Secondary | ICD-10-CM | POA: Diagnosis not present

## 2023-03-07 DIAGNOSIS — M545 Low back pain, unspecified: Secondary | ICD-10-CM | POA: Diagnosis not present

## 2023-03-07 DIAGNOSIS — M25552 Pain in left hip: Secondary | ICD-10-CM | POA: Diagnosis not present

## 2023-03-07 DIAGNOSIS — E538 Deficiency of other specified B group vitamins: Secondary | ICD-10-CM | POA: Diagnosis not present

## 2023-03-07 DIAGNOSIS — M25551 Pain in right hip: Secondary | ICD-10-CM | POA: Diagnosis not present

## 2023-03-12 DIAGNOSIS — L814 Other melanin hyperpigmentation: Secondary | ICD-10-CM | POA: Diagnosis not present

## 2023-03-12 DIAGNOSIS — L570 Actinic keratosis: Secondary | ICD-10-CM | POA: Diagnosis not present

## 2023-03-12 DIAGNOSIS — L821 Other seborrheic keratosis: Secondary | ICD-10-CM | POA: Diagnosis not present

## 2023-03-12 DIAGNOSIS — D225 Melanocytic nevi of trunk: Secondary | ICD-10-CM | POA: Diagnosis not present

## 2023-03-20 DIAGNOSIS — H578A2 Foreign body sensation, left eye: Secondary | ICD-10-CM | POA: Diagnosis not present

## 2023-03-28 DIAGNOSIS — H578A2 Foreign body sensation, left eye: Secondary | ICD-10-CM | POA: Diagnosis not present

## 2023-04-04 DIAGNOSIS — E538 Deficiency of other specified B group vitamins: Secondary | ICD-10-CM | POA: Diagnosis not present

## 2023-04-12 DIAGNOSIS — M4726 Other spondylosis with radiculopathy, lumbar region: Secondary | ICD-10-CM | POA: Diagnosis not present

## 2023-04-17 DIAGNOSIS — M4726 Other spondylosis with radiculopathy, lumbar region: Secondary | ICD-10-CM | POA: Diagnosis not present

## 2023-04-22 DIAGNOSIS — M4726 Other spondylosis with radiculopathy, lumbar region: Secondary | ICD-10-CM | POA: Diagnosis not present

## 2023-04-23 DIAGNOSIS — J432 Centrilobular emphysema: Secondary | ICD-10-CM | POA: Diagnosis not present

## 2023-04-23 DIAGNOSIS — D84821 Immunodeficiency due to drugs: Secondary | ICD-10-CM | POA: Diagnosis not present

## 2023-04-23 DIAGNOSIS — E782 Mixed hyperlipidemia: Secondary | ICD-10-CM | POA: Diagnosis not present

## 2023-04-23 DIAGNOSIS — E538 Deficiency of other specified B group vitamins: Secondary | ICD-10-CM | POA: Diagnosis not present

## 2023-04-23 DIAGNOSIS — Z0000 Encounter for general adult medical examination without abnormal findings: Secondary | ICD-10-CM | POA: Diagnosis not present

## 2023-04-23 DIAGNOSIS — K5090 Crohn's disease, unspecified, without complications: Secondary | ICD-10-CM | POA: Diagnosis not present

## 2023-04-23 DIAGNOSIS — D509 Iron deficiency anemia, unspecified: Secondary | ICD-10-CM | POA: Diagnosis not present

## 2023-04-23 DIAGNOSIS — I700 Atherosclerosis of aorta: Secondary | ICD-10-CM | POA: Diagnosis not present

## 2023-04-23 DIAGNOSIS — I10 Essential (primary) hypertension: Secondary | ICD-10-CM | POA: Diagnosis not present

## 2023-04-23 DIAGNOSIS — Z1331 Encounter for screening for depression: Secondary | ICD-10-CM | POA: Diagnosis not present

## 2023-04-23 DIAGNOSIS — N1831 Chronic kidney disease, stage 3a: Secondary | ICD-10-CM | POA: Diagnosis not present

## 2023-04-25 DIAGNOSIS — M4726 Other spondylosis with radiculopathy, lumbar region: Secondary | ICD-10-CM | POA: Diagnosis not present

## 2023-04-30 DIAGNOSIS — M4726 Other spondylosis with radiculopathy, lumbar region: Secondary | ICD-10-CM | POA: Diagnosis not present

## 2023-05-02 DIAGNOSIS — E538 Deficiency of other specified B group vitamins: Secondary | ICD-10-CM | POA: Diagnosis not present

## 2023-05-03 DIAGNOSIS — M4726 Other spondylosis with radiculopathy, lumbar region: Secondary | ICD-10-CM | POA: Diagnosis not present

## 2023-05-08 DIAGNOSIS — M4726 Other spondylosis with radiculopathy, lumbar region: Secondary | ICD-10-CM | POA: Diagnosis not present

## 2023-05-10 DIAGNOSIS — M4726 Other spondylosis with radiculopathy, lumbar region: Secondary | ICD-10-CM | POA: Diagnosis not present

## 2023-05-20 DIAGNOSIS — M4726 Other spondylosis with radiculopathy, lumbar region: Secondary | ICD-10-CM | POA: Diagnosis not present

## 2023-05-30 NOTE — Progress Notes (Signed)
[facility]  [address] [place],  [place]  [address] [phone]  Clinic Day:  05/31/2023  Referring physician: Blair Heys, MD  HISTORY OF PRESENT ILLNESS:  The patient is a 67 y.o. female who I recently began seeing for iron deficiency anemia that was previously related to her Crohn's disease.  She currently takes 2 iron pills on a daily basis; she previously had an allergic reaction to her IV iron.  Fortunately, the patient has not had a flareup of her Crohn's disease since December 2021.  She comes in today to reassess her iron and hemoglobin levels.  Since her last visit, the patient has been doing fairly well.  She denies having any overt forms of blood loss since her last visit which concern her for recurrent iron deficiency anemia.  PHYSICAL EXAM:  Blood pressure 128/60, pulse 68, temperature 97.9 F (36.6 C), resp. rate 16, height 5\' 3"  (1.6 m), weight 156 lb 4.8 oz (70.9 kg), SpO2 98 %. Wt Readings from Last 3 Encounters:  05/31/23 156 lb 4.8 oz (70.9 kg)  11/29/22 154 lb 14.4 oz (70.3 kg)  10/10/22 152 lb (68.9 kg)   Body mass index is 27.69 kg/m. Performance status (ECOG): 0 - Asymptomatic Physical Exam Constitutional:      Appearance: Normal appearance. She is not ill-appearing.  HENT:     Mouth/Throat:     Mouth: Mucous membranes are moist.     Pharynx: Oropharynx is clear. No oropharyngeal exudate or posterior oropharyngeal erythema.  Cardiovascular:     Rate and Rhythm: Normal rate and regular rhythm.     Heart sounds: No murmur heard.    No friction rub. No gallop.  Pulmonary:     Effort: Pulmonary effort is normal. No respiratory distress.     Breath sounds: Normal breath sounds. No wheezing, rhonchi or rales.  Abdominal:     General: Bowel sounds are normal. There is no distension.     Palpations: Abdomen is soft. There is no mass.     Tenderness: There is no abdominal tenderness.  Musculoskeletal:        General: No  swelling.     Right lower leg: No edema.     Left lower leg: No edema.  Lymphadenopathy:     Cervical: No cervical adenopathy.     Upper Body:     Right upper body: No supraclavicular or axillary adenopathy.     Left upper body: No supraclavicular or axillary adenopathy.     Lower Body: No right inguinal adenopathy. No left inguinal adenopathy.  Skin:    General: Skin is warm.     Coloration: Skin is not jaundiced.     Findings: No lesion or rash.  Neurological:     General: No focal deficit present.     Mental Status: She is alert and oriented to person, place, and time. Mental status is at baseline.  Psychiatric:        Mood and Affect: Mood normal.        Behavior: Behavior normal.        Thought Content: Thought content normal.    LABS:    Latest Reference Range & Units 05/31/23 00:00 05/31/23 10:20  Iron 28 - 170 ug/dL  77  UIBC ug/dL  132  TIBC 440 - 102 ug/dL  725  Saturation Ratios 10.4 - 31.8 %  18  Ferritin 11 - 307 ng/mL  76  WBC  5.7 (C) (E)   RBC  3.87 - 5.11  3.82 ! (C) (E)   Hemoglobin 12.0 - 16.0  12.4 (C) (E)   HCT 36 - 46  37 (C) (E)   Platelets 150 - 400 K/uL 206 (C) (E)   !: Data is abnormal (C): Corrected (E): External lab result  ASSESSMENT & PLAN:  A 67 y.o. female with iron deficiency anemia.  I am pleased as her iron pills are keeping her hemoglobin and iron parameters at suitable levels.  I have no problem with her dropping her iron pills down to once daily.  I would not have a problem stopping her oral iron altogether if she maintains a balanced diet.  Overall, she appears to be doing well.  I will see her back in 1 year for repeat clinical assessment.  The patient understands all the plans discussed today and is in agreement with them.  Taequan Stockhausen Kirby Funk, MD

## 2023-05-31 ENCOUNTER — Inpatient Hospital Stay: Payer: Medicare PPO | Attending: Oncology | Admitting: Oncology

## 2023-05-31 ENCOUNTER — Inpatient Hospital Stay: Payer: Medicare PPO

## 2023-05-31 VITALS — BP 128/60 | HR 68 | Temp 97.9°F | Resp 16 | Ht 63.0 in | Wt 156.3 lb

## 2023-05-31 DIAGNOSIS — D509 Iron deficiency anemia, unspecified: Secondary | ICD-10-CM | POA: Insufficient documentation

## 2023-05-31 DIAGNOSIS — D508 Other iron deficiency anemias: Secondary | ICD-10-CM

## 2023-05-31 DIAGNOSIS — D500 Iron deficiency anemia secondary to blood loss (chronic): Secondary | ICD-10-CM

## 2023-05-31 DIAGNOSIS — K5090 Crohn's disease, unspecified, without complications: Secondary | ICD-10-CM | POA: Diagnosis not present

## 2023-05-31 DIAGNOSIS — D649 Anemia, unspecified: Secondary | ICD-10-CM | POA: Diagnosis not present

## 2023-05-31 LAB — FERRITIN: Ferritin: 76 ng/mL (ref 11–307)

## 2023-05-31 LAB — CBC AND DIFFERENTIAL
HCT: 37 (ref 36–46)
Hemoglobin: 12.4 (ref 12.0–16.0)
Neutrophils Absolute: 3.31
Platelets: 206 10*3/uL (ref 150–400)
WBC: 5.7

## 2023-05-31 LAB — IRON AND TIBC
Iron: 77 ug/dL (ref 28–170)
Saturation Ratios: 18 % (ref 10.4–31.8)
TIBC: 438 ug/dL (ref 250–450)
UIBC: 361 ug/dL

## 2023-05-31 LAB — CBC: RBC: 3.82 — AB (ref 3.87–5.11)

## 2023-06-02 ENCOUNTER — Other Ambulatory Visit: Payer: Self-pay | Admitting: Oncology

## 2023-06-02 DIAGNOSIS — D500 Iron deficiency anemia secondary to blood loss (chronic): Secondary | ICD-10-CM

## 2023-06-03 ENCOUNTER — Telehealth: Payer: Self-pay | Admitting: Oncology

## 2023-06-03 DIAGNOSIS — E538 Deficiency of other specified B group vitamins: Secondary | ICD-10-CM | POA: Diagnosis not present

## 2023-06-03 NOTE — Telephone Encounter (Signed)
Contacted pt to schedule an appt (1 yr follow up). Unable to reach via phone, voicemail was left.   Scheduling Message Entered by Rennis Harding A on 06/02/2023 at  8:38 PM Priority: Routine <No visit type provided>  Department: [facility]  Provider:  Scheduling Notes:  Labs and appt in 05-30-23

## 2023-07-03 DIAGNOSIS — E538 Deficiency of other specified B group vitamins: Secondary | ICD-10-CM | POA: Diagnosis not present

## 2023-07-04 ENCOUNTER — Ambulatory Visit: Admit: 2023-07-04 | Discharge: 2023-07-05 | Payer: MEDICARE | Attending: Gastroenterology | Primary: Gastroenterology

## 2023-07-04 DIAGNOSIS — K50919 Crohn's disease, unspecified, with unspecified complications: Principal | ICD-10-CM

## 2023-07-04 MED ORDER — CIPROFLOXACIN 500 MG TABLET
ORAL_TABLET | Freq: Two times a day (BID) | ORAL | 3 refills | 15 days | Status: CP
Start: 2023-07-04 — End: 2023-07-14

## 2023-07-10 DIAGNOSIS — K580 Irritable bowel syndrome with diarrhea: Principal | ICD-10-CM

## 2023-07-10 MED ORDER — RIFAXIMIN 550 MG TABLET
ORAL_TABLET | Freq: Two times a day (BID) | ORAL | 2 refills | 30 days | Status: CP
Start: 2023-07-10 — End: ?

## 2023-07-18 DIAGNOSIS — K50919 Crohn's disease, unspecified, with unspecified complications: Principal | ICD-10-CM

## 2023-07-18 MED ORDER — CIPROFLOXACIN 500 MG TABLET
ORAL_TABLET | Freq: Two times a day (BID) | ORAL | 0 refills | 10 days | Status: CP
Start: 2023-07-18 — End: 2023-07-28

## 2023-07-25 ENCOUNTER — Ambulatory Visit: Payer: Medicare PPO

## 2023-07-30 ENCOUNTER — Other Ambulatory Visit: Payer: Self-pay | Admitting: Emergency Medicine

## 2023-07-30 DIAGNOSIS — Z122 Encounter for screening for malignant neoplasm of respiratory organs: Secondary | ICD-10-CM

## 2023-07-30 DIAGNOSIS — Z87891 Personal history of nicotine dependence: Secondary | ICD-10-CM

## 2023-07-31 DIAGNOSIS — E538 Deficiency of other specified B group vitamins: Secondary | ICD-10-CM | POA: Diagnosis not present

## 2023-08-06 ENCOUNTER — Ambulatory Visit
Admission: RE | Admit: 2023-08-06 | Discharge: 2023-08-06 | Disposition: A | Discharge status: Home / Self Care | Payer: Medicare PPO | Source: Ambulatory Visit | Attending: Acute Care | Admitting: Acute Care

## 2023-08-06 DIAGNOSIS — Z87891 Personal history of nicotine dependence: Secondary | ICD-10-CM | POA: Diagnosis not present

## 2023-08-06 DIAGNOSIS — Z122 Encounter for screening for malignant neoplasm of respiratory organs: Secondary | ICD-10-CM | POA: Diagnosis not present

## 2023-08-14 ENCOUNTER — Other Ambulatory Visit: Payer: Self-pay

## 2023-08-14 DIAGNOSIS — Z122 Encounter for screening for malignant neoplasm of respiratory organs: Secondary | ICD-10-CM

## 2023-08-14 DIAGNOSIS — Z87891 Personal history of nicotine dependence: Secondary | ICD-10-CM

## 2023-08-28 DIAGNOSIS — E538 Deficiency of other specified B group vitamins: Secondary | ICD-10-CM | POA: Diagnosis not present

## 2023-09-18 DIAGNOSIS — H25813 Combined forms of age-related cataract, bilateral: Secondary | ICD-10-CM | POA: Diagnosis not present

## 2023-09-18 DIAGNOSIS — H04223 Epiphora due to insufficient drainage, bilateral lacrimal glands: Secondary | ICD-10-CM | POA: Diagnosis not present

## 2023-09-18 DIAGNOSIS — H524 Presbyopia: Secondary | ICD-10-CM | POA: Diagnosis not present

## 2023-09-18 DIAGNOSIS — H35361 Drusen (degenerative) of macula, right eye: Secondary | ICD-10-CM | POA: Diagnosis not present

## 2023-09-18 DIAGNOSIS — H04123 Dry eye syndrome of bilateral lacrimal glands: Secondary | ICD-10-CM | POA: Diagnosis not present

## 2023-09-27 DIAGNOSIS — K5090 Crohn's disease, unspecified, without complications: Secondary | ICD-10-CM | POA: Diagnosis not present

## 2023-09-27 DIAGNOSIS — E538 Deficiency of other specified B group vitamins: Secondary | ICD-10-CM | POA: Diagnosis not present

## 2023-10-01 DIAGNOSIS — R92323 Mammographic fibroglandular density, bilateral breasts: Secondary | ICD-10-CM | POA: Diagnosis not present

## 2023-10-01 DIAGNOSIS — Z1231 Encounter for screening mammogram for malignant neoplasm of breast: Secondary | ICD-10-CM | POA: Diagnosis not present

## 2023-10-03 DIAGNOSIS — K50919 Crohn's disease, unspecified, with unspecified complications: Principal | ICD-10-CM

## 2023-10-03 MED ORDER — USTEKINUMAB 90 MG/ML SUBCUTANEOUS SYRINGE
SUBCUTANEOUS | 6 refills | 56 days
Start: 2023-10-03 — End: ?

## 2023-10-29 DIAGNOSIS — E538 Deficiency of other specified B group vitamins: Secondary | ICD-10-CM | POA: Diagnosis not present

## 2023-11-01 DIAGNOSIS — Z79620 Long term (current) use of immunosuppressive biologic: Secondary | ICD-10-CM | POA: Diagnosis not present

## 2023-11-01 DIAGNOSIS — K5090 Crohn's disease, unspecified, without complications: Secondary | ICD-10-CM | POA: Diagnosis not present

## 2023-11-01 DIAGNOSIS — E782 Mixed hyperlipidemia: Secondary | ICD-10-CM | POA: Diagnosis not present

## 2023-11-01 DIAGNOSIS — K219 Gastro-esophageal reflux disease without esophagitis: Secondary | ICD-10-CM | POA: Diagnosis not present

## 2023-11-01 DIAGNOSIS — F411 Generalized anxiety disorder: Secondary | ICD-10-CM | POA: Diagnosis not present

## 2023-11-01 DIAGNOSIS — E538 Deficiency of other specified B group vitamins: Secondary | ICD-10-CM | POA: Diagnosis not present

## 2023-11-01 DIAGNOSIS — E876 Hypokalemia: Secondary | ICD-10-CM | POA: Diagnosis not present

## 2023-11-01 DIAGNOSIS — D500 Iron deficiency anemia secondary to blood loss (chronic): Secondary | ICD-10-CM | POA: Diagnosis not present

## 2023-11-05 DIAGNOSIS — J988 Other specified respiratory disorders: Secondary | ICD-10-CM | POA: Diagnosis not present

## 2023-11-29 DIAGNOSIS — E538 Deficiency of other specified B group vitamins: Secondary | ICD-10-CM | POA: Diagnosis not present

## 2024-02-12 ENCOUNTER — Ambulatory Visit: Admitting: Podiatry

## 2024-02-12 DIAGNOSIS — L600 Ingrowing nail: Secondary | ICD-10-CM | POA: Diagnosis not present

## 2024-02-12 NOTE — Patient Instructions (Signed)

## 2024-02-12 NOTE — Progress Notes (Signed)
 Subjective:  Patient ID: Cheryl Ferguson, female    DOB: [birth year],   MRN: [id number]  No chief complaint on file.   68 y.o. female presents for concern of right great toe pain swelling and redness that has been ongoing for about a month. Relates it has improved using neosporin but not going away.  She has followed with Dr. Logan Bores in the past and has had ingrown nail procedure on the right lateral border previously.  . Denies any other pedal complaints. Denies n/v/f/c.   Past Medical History:  Diagnosis Date   Anemia    Anxiety    Aortic atherosclerosis ([facility]) 03/19/2019   Noted in CT abd   Arthritis    Crohn disease (HCC)    GERD (gastroesophageal reflux disease)    High cholesterol    History of kidney stones 04/10/2019   7mm calculus in right ureter, noted on CT abd   Hydroureteronephrosis    Moderate, right,noted on CT abd   Meniere disease    Phlebitis    Bilateral lower legs   Seasonal allergies    Trigeminy    Ulcer of abdomen wall (HCC)    stomach ulcers   Ventricular tachycardia ([facility]) 2009    Objective:  Physical Exam: Vascular: DP/PT pulses 2/4 bilateral. CFT <3 seconds. Normal hair growth on digits. No edema.  Skin. No lacerations or abrasions bilateral feet. Incurvation of lateral border of right great toe. Mild edema and erythema present. No purulence noted Musculoskeletal: MMT 5/5 bilateral lower extremities in DF, PF, Inversion and Eversion. Deceased ROM in DF of ankle joint.  Neurological: Sensation intact to light touch.   Assessment:   1. Ingrown toenail of right foot      Plan:  Patient was evaluated and treated and all questions answered. Discussed ingrown toenails etiology and treatment options including procedure for removal vs conservative care.  Patient requesting removal of ingrown nail today. Procedure below.  Discussed procedure and post procedure care and patient expressed understanding.  Will follow-up in 2 weeks for nail check or  sooner if any problems arise.    Procedure:  Procedure: partial Nail Avulsion of right hallux latearl nail border.  Surgeon: Louann Sjogren, DPM  Pre-op Dx: Ingrown toenail without infection Post-op: Same  Place of Surgery: Office exam room.  Indications for surgery: Painful and ingrown toenail.    The patient is requesting removal of nail with  chemical matrixectomy. Risks and complications were discussed with the patient for which they understand and written consent was obtained. Under sterile conditions a total of 3 mL of  1% lidocaine plain was infiltrated in a hallux block fashion. Once anesthetized, the skin was prepped in sterile fashion. A tourniquet was then applied. Next the lateral aspect of hallux nail border was then sharply excised making sure to remove the entire offending nail border.  Next phenol was then applied under standard conditions to permanently destroy the matrix and copiously irrigated. Silvadene was applied. A dry sterile dressing was applied. After application of the dressing the tourniquet was removed and there is found to be an immediate capillary refill time to the digit. The patient tolerated the procedure well without any complications. Post procedure instructions were discussed the patient for which he verbally understood. Follow-up in two weeks for nail check or sooner if any problems are to arise. Discussed signs/symptoms of infection and directed to call the office immediately should any occur or go directly to the emergency room. In the meantime, encouraged to call  the office with any questions, concerns, changes symptoms.   Louann Sjogren, DPM

## 2024-02-26 ENCOUNTER — Ambulatory Visit: Admitting: Podiatry

## 2024-02-26 DIAGNOSIS — L600 Ingrowing nail: Secondary | ICD-10-CM

## 2024-02-26 MED ORDER — DOXYCYCLINE HYCLATE 100 MG PO TABS: 100.0000 mg | ORAL_TABLET | Freq: Two times a day (BID) | ORAL | 0 refills | Status: AC

## 2024-02-26 NOTE — Progress Notes (Signed)
  Subjective:  Patient ID: Cheryl Ferguson, female    DOB: 18-Sep-1956,   MRN: [id number]  No chief complaint on file.   68 y.o. female presents for follow-up of right ingrown nail procedure. Relates doing well but still some redness in the toe at the end of the day. Has slowed on soaks . Denies any other pedal complaints. Denies n/v/f/c.   Past Medical History:  Diagnosis Date   Anemia    Anxiety    Aortic atherosclerosis ([facility]) 03/19/2019   Noted in CT abd   Arthritis    Crohn disease ([facility])    GERD (gastroesophageal reflux disease)    High cholesterol    History of kidney stones 04/10/2019   7mm calculus in right ureter, noted on CT abd   Hydroureteronephrosis    Moderate, right,noted on CT abd   Meniere disease    Phlebitis    Bilateral lower legs   Seasonal allergies    Trigeminy    Ulcer of abdomen wall (H[facility])    stomach ulcers   Ventricular tachycardia ([facility]) 2009    Objective:  Physical Exam: Vascular: DP/PT pulses 2/4 bilateral. CFT <3 seconds. Normal hair growth on digits. No edema.  Skin. No lacerations or abrasions bilateral feet. Overall right hallux nail healing well. Mild erythema noted to proximal nail fold.  Musculoskeletal: MMT 5/5 bilateral lower extremities in DF, PF, Inversion and Eversion. Deceased ROM in DF of ankle joint.  Neurological: Sensation intact to light touch.   Assessment:   1. Ingrown toenail of right foot      Plan:  Patient was evaluated and treated and all questions answered. Toe was evaluated and appears to be healing well.  May discontinue soaks and neosporin at night and leave open to air  Doxycycline sent to pharmacy as precaution.  Patient to follow-up as needed.    Louann Sjogren, DPM

## 2024-04-17 ENCOUNTER — Encounter: Payer: Self-pay | Admitting: Podiatry

## 2024-04-17 ENCOUNTER — Ambulatory Visit: Admitting: Podiatry

## 2024-04-17 DIAGNOSIS — L600 Ingrowing nail: Secondary | ICD-10-CM | POA: Diagnosis not present

## 2024-04-17 NOTE — Progress Notes (Signed)
 Chief Complaint  Patient presents with   Nail Problem    "I had an ingrown toenail removed in March.  I guess it is back."    Subjective: Patient presents today for evaluation of pain to the lateral border right great toe. Patient is concerned for possible ingrown nail.  It is very sensitive to touch.  Patient presents today for further treatment and evaluation.  Past Medical History:  Diagnosis Date   Anemia    Anxiety    Aortic atherosclerosis (HCC) 03/19/2019   Noted in CT abd   Arthritis    Crohn disease (HCC)    GERD (gastroesophageal reflux disease)    High cholesterol    History of kidney stones 04/10/2019   7mm calculus in right ureter, noted on CT abd   Hydroureteronephrosis    Moderate, right,noted on CT abd   Meniere disease    Phlebitis    Bilateral lower legs   Seasonal allergies    Trigeminy    Ulcer of abdomen wall (HCC)    stomach ulcers   Ventricular tachycardia (HCC) 2009    Past Surgical History:  Procedure Laterality Date   APPENDECTOMY     BOWEL RESECTION     CESAREAN SECTION     COLONOSCOPY  03/2019   EXTRACORPOREAL SHOCK WAVE LITHOTRIPSY Right 04/16/2019   Procedure: EXTRACORPOREAL SHOCK WAVE LITHOTRIPSY (ESWL);  Surgeon: Andrez Banker, MD;  Location: [facility];  Service: Urology;  Laterality: Right;   EXTRACORPOREAL SHOCK WAVE LITHOTRIPSY Left 08/23/2022   Procedure: EXTRACORPOREAL SHOCK WAVE LITHOTRIPSY (ESWL);  Surgeon: Geraline Knapp, MD;  Location: [facility];  Service: Urology;  Laterality: Left;   FOOT SURGERY Left    OVARIAN CYST REMOVAL     TONSILLECTOMY     UPPER GI ENDOSCOPY      Allergies  Allergen Reactions   Iron  Sucrose     Feet/legs/hand/face swelling, nausea, headaches,    Remicade  [Infliximab ] Shortness Of Breath   Entocort Ec [Budesonide] Other (See Comments)    Lowers Magnesium level   Codeine Nausea Only   Esomeprazole Magnesium     Drops magnesium and potassium    Zolpidem     Other reaction(s):  Confusion Other reaction(s): Other (See Comments) Sleep walking   Zolpidem Tartrate Other (See Comments)    Sleep walking    Cephalexin Rash   Clindamycin  Nausea Only    Other reaction(s): Other (See Comments) Severe Nausea / diarrhea   Trazodone Palpitations    Objective:  General: Well developed, nourished, in no acute distress, alert and oriented x3   Dermatology: Skin is warm, dry and supple bilateral.  Lateral border right great toe is tender with evidence of an ingrowing nail. Pain on palpation noted to the border of the nail fold. The remaining nails appear unremarkable at this time.   Vascular: DP and PT pulses palpable.  No clinical evidence of vascular compromise  Neruologic: Grossly intact via light touch bilateral.  Musculoskeletal: No pedal deformity noted  Assesement: #1 Paronychia with ingrowing nail lateral border right great toe #2 history of recalcitrant ingrowing toenail lateral border right great toe.  Last partial nail matricectomy was performed 02/26/2024.  Dr. Alvah Auerbach  Plan of Care:  -Patient evaluated.  -Discussed treatment alternatives and plan of care. Explained nail avulsion procedure and post procedure course to patient. -Patient opted for permanent partial nail avulsion of the ingrown portion of the nail.  -Prior to procedure, local anesthesia infiltration utilized using 3 ml of a 50:50  mixture of 2% plain lidocaine  and 0.5% plain marcaine in a normal hallux block fashion and a betadine prep performed.  -Partial permanent nail avulsion with chemical matrixectomy performed using 3x30sec applications of phenol followed by alcohol flush.  -Light dressing applied.  Post care instructions provided -Return to clinic 3 weeks  Dot Gazella, DPM [facility]  Dr. Dot Gazella, DPM    [address] [place], [place] [address]                Office [phone]  Fax [phone]

## 2024-04-27 ENCOUNTER — Other Ambulatory Visit: Payer: Self-pay | Admitting: Gastroenterology

## 2024-04-27 DIAGNOSIS — K50119 Crohn's disease of large intestine with unspecified complications: Secondary | ICD-10-CM

## 2024-05-08 ENCOUNTER — Ambulatory Visit: Admitting: Podiatry

## 2024-05-12 ENCOUNTER — Ambulatory Visit
Admission: RE | Admit: 2024-05-12 | Discharge: 2024-05-12 | Disposition: A | Discharge status: Home / Self Care | Source: Ambulatory Visit | Attending: Gastroenterology | Admitting: Gastroenterology

## 2024-05-12 DIAGNOSIS — K50119 Crohn's disease of large intestine with unspecified complications: Secondary | ICD-10-CM

## 2024-05-12 MED ORDER — IOPAMIDOL (ISOVUE-300) INJECTION 61%
70.0000 mL | Freq: Once | INTRAVENOUS | Status: AC | PRN
  Administered 2024-05-12: 70 mL via INTRAVENOUS

## 2024-05-31 ENCOUNTER — Other Ambulatory Visit: Payer: Self-pay | Admitting: Oncology

## 2024-05-31 DIAGNOSIS — D500 Iron deficiency anemia secondary to blood loss (chronic): Secondary | ICD-10-CM

## 2024-05-31 NOTE — Progress Notes (Unsigned)
 [facility]  [address] [place],  [place]  [address] [phone]  Clinic Day:  06/01/2024  Referring physician: Hugh Charleston, MD  HISTORY OF PRESENT ILLNESS:  The patient is a 68 y.o. female who I have seen in the past for iron  deficiency anemia that was previously related to her Crohn's disease.  She currently takes 1 iron  pill daily.  She previously had an allergic reaction to IV iron .  Fortunately, the patient has not had a flareup of her Crohn's disease since December 2021 that has exacerbated further GI bleeding 6.  She comes in today to reassess her iron  and hemoglobin levels.  Since her last visit, the patient has been doing well.  She denies having increased fatigue or any overt forms of blood loss since her last visit which concern her for recurrent iron  deficiency anemia.  PHYSICAL EXAM:  Blood pressure 121/79, pulse 66, temperature 97.9 F (36.6 C), temperature source Oral, resp. rate 16, height 5' 3 (1.6 m), weight 158 lb 12.8 oz (72 kg), SpO2 97%. Wt Readings from Last 3 Encounters:  06/01/24 158 lb 12.8 oz (72 kg)  08/06/23 158 lb (71.7 kg)  05/31/23 156 lb 4.8 oz (70.9 kg)   Body mass index is 28.13 kg/m. Performance status (ECOG): 0 - Asymptomatic Physical Exam Constitutional:      Appearance: Normal appearance. She is not ill-appearing.  HENT:     Mouth/Throat:     Mouth: Mucous membranes are moist.     Pharynx: Oropharynx is clear. No oropharyngeal exudate or posterior oropharyngeal erythema.   Cardiovascular:     Rate and Rhythm: Normal rate and regular rhythm.     Heart sounds: No murmur heard.    No friction rub. No gallop.  Pulmonary:     Effort: Pulmonary effort is normal. No respiratory distress.     Breath sounds: Normal breath sounds. No wheezing, rhonchi or rales.  Abdominal:     General: Bowel sounds are normal. There is no distension.     Palpations: Abdomen is soft. There is no mass.     Tenderness: There  is no abdominal tenderness.   Musculoskeletal:        General: No swelling.     Right lower leg: No edema.     Left lower leg: No edema.  Lymphadenopathy:     Cervical: No cervical adenopathy.     Upper Body:     Right upper body: No supraclavicular or axillary adenopathy.     Left upper body: No supraclavicular or axillary adenopathy.     Lower Body: No right inguinal adenopathy. No left inguinal adenopathy.   Skin:    General: Skin is warm.     Coloration: Skin is not jaundiced.     Findings: No lesion or rash.   Neurological:     General: No focal deficit present.     Mental Status: She is alert and oriented to person, place, and time. Mental status is at baseline.   Psychiatric:        Mood and Affect: Mood normal.        Behavior: Behavior normal.        Thought Content: Thought content normal.    LABS:    Latest Reference Range & Units 06/01/24 10:42  WBC 4.0 - 10.5 K/uL 5.2  RBC 3.87 - 5.11 MIL/uL 4.06  Hemoglobin 12.0 - 15.0 g/dL 87.2  HCT 63.9 - 53.9 % 40.3  MCV 80.0 - 100.0 fL  99.3  MCH 26.0 - 34.0 pg 31.3  MCHC 30.0 - 36.0 g/dL 68.4  RDW 88.4 - 84.4 % 13.6  Platelets 150 - 400 K/uL 214  nRBC 0.0 - 0.2 % 0.0  Neutrophils % 57  Lymphocytes % 33  Monocytes Relative % 7  Eosinophil % 3  Basophil % 0  Immature Granulocytes % 0    Latest Reference Range & Units 06/01/24 10:42  Iron  28 - 170 ug/dL 76  UIBC ug/dL 617  TIBC 749 - 549 ug/dL 541 (H)  Saturation Ratios 10.4 - 31.8 % 17  Ferritin 11 - 307 ng/mL 92  (H): Data is abnormally high  ASSESSMENT & PLAN:  A 67 y.o. female with iron  deficiency anemia.  I am pleased as her iron  and hemoglobin levels remain ideal.  She continues to take 1 iron  pill daily, which appears to be doing well in preserving her iron  stores.  As she has no other pressing hematologic issues, I do feel comfortable turning her care back over to to her primary care physician, with the recommendation that her CBC/iron  studies to be  checked 1-2 times per year.  I would not have a problem seeing her in the future if new hematologic changes develop that require repeat clinical attention. The patient understands all the plans discussed today and is in agreement with them.  Commodore Bellew DELENA Kerns, MD

## 2024-06-01 ENCOUNTER — Inpatient Hospital Stay: Payer: Medicare PPO

## 2024-06-01 ENCOUNTER — Inpatient Hospital Stay: Payer: Medicare PPO | Attending: Oncology | Admitting: Oncology

## 2024-06-01 VITALS — BP 121/79 | HR 66 | Temp 97.9°F | Resp 16 | Ht 63.0 in | Wt 158.8 lb

## 2024-06-01 DIAGNOSIS — D508 Other iron deficiency anemias: Secondary | ICD-10-CM

## 2024-06-01 DIAGNOSIS — D500 Iron deficiency anemia secondary to blood loss (chronic): Secondary | ICD-10-CM

## 2024-06-01 DIAGNOSIS — D509 Iron deficiency anemia, unspecified: Secondary | ICD-10-CM | POA: Insufficient documentation

## 2024-06-01 LAB — CMP (CANCER CENTER ONLY)
ALT: 27 U/L (ref 0–44)
AST: 44 U/L — ABNORMAL HIGH (ref 15–41)
Albumin: 4.6 g/dL (ref 3.5–5.0)
Alkaline Phosphatase: 102 U/L (ref 38–126)
Anion gap: 13 (ref 5–15)
BUN: 17 mg/dL (ref 8–23)
CO2: 21 mmol/L — ABNORMAL LOW (ref 22–32)
Calcium: 9.8 mg/dL (ref 8.9–10.3)
Chloride: 109 mmol/L (ref 98–111)
Creatinine: 1.2 mg/dL — ABNORMAL HIGH (ref 0.44–1.00)
GFR, Estimated: 49 mL/min — ABNORMAL LOW (ref >60)
Glucose, Bld: 102 mg/dL — ABNORMAL HIGH (ref 70–99)
Potassium: 4 mmol/L (ref 3.5–5.1)
Sodium: 143 mmol/L (ref 135–145)
Total Bilirubin: 0.3 mg/dL (ref 0.0–1.2)
Total Protein: 7.2 g/dL (ref 6.5–8.1)

## 2024-06-01 LAB — CBC WITH DIFFERENTIAL (CANCER CENTER ONLY)
Abs Immature Granulocytes: 0.01 10*3/uL (ref 0.00–0.07)
Basophils Absolute: 0 10*3/uL (ref 0.0–0.1)
Basophils Relative: 0 %
Eosinophils Absolute: 0.2 10*3/uL (ref 0.0–0.5)
Eosinophils Relative: 3 %
HCT: 40.3 % (ref 36.0–46.0)
Hemoglobin: 12.7 g/dL (ref 12.0–15.0)
Immature Granulocytes: 0 %
Lymphocytes Relative: 33 %
Lymphs Abs: 1.7 10*3/uL (ref 0.7–4.0)
MCH: 31.3 pg (ref 26.0–34.0)
MCHC: 31.5 g/dL (ref 30.0–36.0)
MCV: 99.3 fL (ref 80.0–100.0)
Monocytes Absolute: 0.4 10*3/uL (ref 0.1–1.0)
Monocytes Relative: 7 %
Neutro Abs: 3 10*3/uL (ref 1.7–7.7)
Neutrophils Relative %: 57 %
Platelet Count: 214 10*3/uL (ref 150–400)
RBC: 4.06 MIL/uL (ref 3.87–5.11)
RDW: 13.6 % (ref 11.5–15.5)
WBC Count: 5.2 10*3/uL (ref 4.0–10.5)
nRBC: 0 % (ref 0.0–0.2)

## 2024-06-01 LAB — IRON AND TIBC
Iron: 76 ug/dL (ref 28–170)
Saturation Ratios: 17 % (ref 10.4–31.8)
TIBC: 458 ug/dL — ABNORMAL HIGH (ref 250–450)
UIBC: 382 ug/dL

## 2024-06-01 LAB — FERRITIN: Ferritin: 92 ng/mL (ref 11–307)

## 2024-08-03 DIAGNOSIS — E538 Deficiency of other specified B group vitamins: Secondary | ICD-10-CM | POA: Diagnosis not present

## 2024-08-06 ENCOUNTER — Ambulatory Visit
Admission: RE | Admit: 2024-08-06 | Discharge: 2024-08-06 | Disposition: A | Discharge status: Home / Self Care | Source: Ambulatory Visit | Attending: Acute Care | Admitting: Acute Care

## 2024-08-06 DIAGNOSIS — Z87891 Personal history of nicotine dependence: Secondary | ICD-10-CM | POA: Insufficient documentation

## 2024-08-06 DIAGNOSIS — Z122 Encounter for screening for malignant neoplasm of respiratory organs: Secondary | ICD-10-CM | POA: Diagnosis not present

## 2024-08-19 DIAGNOSIS — K5090 Crohn's disease, unspecified, without complications: Secondary | ICD-10-CM | POA: Diagnosis not present

## 2024-08-19 DIAGNOSIS — K219 Gastro-esophageal reflux disease without esophagitis: Secondary | ICD-10-CM | POA: Diagnosis not present

## 2024-09-02 DIAGNOSIS — N390 Urinary tract infection, site not specified: Secondary | ICD-10-CM | POA: Diagnosis not present

## 2024-09-02 DIAGNOSIS — R350 Frequency of micturition: Secondary | ICD-10-CM | POA: Diagnosis not present

## 2024-09-03 DIAGNOSIS — E538 Deficiency of other specified B group vitamins: Secondary | ICD-10-CM | POA: Diagnosis not present

## 2024-10-01 DIAGNOSIS — E538 Deficiency of other specified B group vitamins: Secondary | ICD-10-CM | POA: Diagnosis not present

## 2024-10-06 ENCOUNTER — Ambulatory Visit: Admit: 2024-10-06 | Discharge: 2024-10-06 | Payer: Medicare (Managed Care)

## 2024-10-06 DIAGNOSIS — D84821 Immunosuppression due to drug therapy (HHS-HCC): Principal | ICD-10-CM

## 2024-10-06 DIAGNOSIS — E538 Deficiency of other specified B group vitamins: Principal | ICD-10-CM

## 2024-10-06 DIAGNOSIS — K50919 Crohn's disease, unspecified, with unspecified complications: Principal | ICD-10-CM

## 2024-10-06 DIAGNOSIS — K9089 Other intestinal malabsorption: Principal | ICD-10-CM

## 2024-10-06 DIAGNOSIS — K50818 Crohn's disease of both small and large intestine with other complication: Principal | ICD-10-CM

## 2024-10-06 DIAGNOSIS — D649 Anemia, unspecified: Secondary | ICD-10-CM | POA: Diagnosis not present

## 2024-10-06 DIAGNOSIS — J439 Emphysema, unspecified: Secondary | ICD-10-CM | POA: Diagnosis not present

## 2024-10-06 DIAGNOSIS — Z79899 Other long term (current) drug therapy: Secondary | ICD-10-CM | POA: Diagnosis not present

## 2024-10-06 DIAGNOSIS — Z885 Allergy status to narcotic agent status: Secondary | ICD-10-CM | POA: Diagnosis not present

## 2024-10-06 DIAGNOSIS — E785 Hyperlipidemia, unspecified: Secondary | ICD-10-CM | POA: Diagnosis not present

## 2024-10-06 MED ORDER — COLESTIPOL 1 GRAM TABLET
ORAL_TABLET | Freq: Two times a day (BID) | ORAL | 0 refills | 30.00000 days | Status: CP
Start: 2024-10-06 — End: 2024-11-05

## 2024-10-07 DIAGNOSIS — K50919 Crohn's disease, unspecified, with unspecified complications: Principal | ICD-10-CM

## 2024-10-08 DIAGNOSIS — K50818 Crohn's disease of both small and large intestine with other complication: Principal | ICD-10-CM

## 2024-10-09 DIAGNOSIS — J988 Other specified respiratory disorders: Secondary | ICD-10-CM | POA: Diagnosis not present

## 2024-10-29 DIAGNOSIS — K9089 Other intestinal malabsorption: Principal | ICD-10-CM

## 2024-10-29 DIAGNOSIS — K50919 Crohn's disease, unspecified, with unspecified complications: Principal | ICD-10-CM

## 2024-10-29 MED ORDER — COLESTIPOL 1 GRAM TABLET
ORAL_TABLET | 1 refills | 0.00000 days
Start: 2024-10-29 — End: ?

## 2024-10-30 DIAGNOSIS — K219 Gastro-esophageal reflux disease without esophagitis: Secondary | ICD-10-CM | POA: Diagnosis not present

## 2024-10-30 DIAGNOSIS — E782 Mixed hyperlipidemia: Secondary | ICD-10-CM | POA: Diagnosis not present

## 2024-10-30 DIAGNOSIS — I700 Atherosclerosis of aorta: Secondary | ICD-10-CM | POA: Diagnosis not present

## 2024-10-30 DIAGNOSIS — N1831 Chronic kidney disease, stage 3a: Secondary | ICD-10-CM | POA: Diagnosis not present

## 2024-10-30 DIAGNOSIS — E538 Deficiency of other specified B group vitamins: Secondary | ICD-10-CM | POA: Diagnosis not present

## 2024-10-30 DIAGNOSIS — I10 Essential (primary) hypertension: Secondary | ICD-10-CM | POA: Diagnosis not present

## 2024-10-30 DIAGNOSIS — R7303 Prediabetes: Secondary | ICD-10-CM | POA: Diagnosis not present

## 2024-10-30 DIAGNOSIS — K5090 Crohn's disease, unspecified, without complications: Secondary | ICD-10-CM | POA: Diagnosis not present

## 2024-10-30 DIAGNOSIS — J432 Centrilobular emphysema: Secondary | ICD-10-CM | POA: Diagnosis not present

## 2024-11-26 DIAGNOSIS — K50919 Crohn's disease, unspecified, with unspecified complications: Principal | ICD-10-CM

## 2024-11-26 MED ORDER — USTEKINUMAB 90 MG/ML SUBCUTANEOUS SYRINGE
SUBCUTANEOUS | 3 refills | 56.00000 days | Status: CP
Start: 2024-11-26 — End: ?

## 2024-12-29 ENCOUNTER — Other Ambulatory Visit: Payer: Self-pay | Admitting: Family Medicine

## 2024-12-29 DIAGNOSIS — R519 Headache, unspecified: Secondary | ICD-10-CM

## 2024-12-30 ENCOUNTER — Encounter: Payer: Self-pay | Admitting: Family Medicine

## 2025-01-08 ENCOUNTER — Ambulatory Visit
Admission: RE | Admit: 2025-01-08 | Discharge: 2025-01-08 | Disposition: A | Source: Ambulatory Visit | Attending: Family Medicine | Admitting: Family Medicine

## 2025-01-08 DIAGNOSIS — R519 Headache, unspecified: Secondary | ICD-10-CM
